# Patient Record
Sex: Female | Born: 1985 | Race: White | Hispanic: No | Marital: Married | Smoking: Former smoker
Health system: Southern US, Community
[De-identification: ages and names within clinical notes are randomized; demographics above are authoritative.]

## PROBLEM LIST (undated history)

## (undated) DIAGNOSIS — F329 Major depressive disorder, single episode, unspecified: Secondary | ICD-10-CM

## (undated) DIAGNOSIS — R87629 Unspecified abnormal cytological findings in specimens from vagina: Secondary | ICD-10-CM

## (undated) DIAGNOSIS — F32A Depression, unspecified: Secondary | ICD-10-CM

## (undated) DIAGNOSIS — R87810 Cervical high risk human papillomavirus (HPV) DNA test positive: Secondary | ICD-10-CM

## (undated) DIAGNOSIS — I10 Essential (primary) hypertension: Secondary | ICD-10-CM

## (undated) HISTORY — DX: Essential (primary) hypertension: I10

## (undated) HISTORY — DX: Major depressive disorder, single episode, unspecified: F32.9

## (undated) HISTORY — PX: OOPHORECTOMY: SHX86

## (undated) HISTORY — PX: LAPAROSCOPIC APPENDECTOMY: SUR753

## (undated) HISTORY — DX: Unspecified abnormal cytological findings in specimens from vagina: R87.629

## (undated) HISTORY — PX: OVARIAN CYST REMOVAL: SHX89

## (undated) HISTORY — DX: Cervical high risk human papillomavirus (HPV) DNA test positive: R87.810

## (undated) HISTORY — DX: Depression, unspecified: F32.A

---
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
## 2004-04-19 ENCOUNTER — Ambulatory Visit (HOSPITAL_COMMUNITY): Admission: RE | Admit: 2004-04-19 | Discharge: 2004-04-19 | Payer: Self-pay | Admitting: Family Medicine

## 2004-05-19 ENCOUNTER — Other Ambulatory Visit: Admission: RE | Admit: 2004-05-19 | Discharge: 2004-05-19 | Payer: Self-pay | Admitting: Obstetrics and Gynecology

## 2004-05-20 ENCOUNTER — Other Ambulatory Visit: Admission: RE | Admit: 2004-05-20 | Discharge: 2004-05-20 | Payer: Self-pay | Admitting: Obstetrics and Gynecology

## 2005-09-10 ENCOUNTER — Inpatient Hospital Stay (HOSPITAL_COMMUNITY): Admission: AD | Admit: 2005-09-10 | Discharge: 2005-09-13 | Payer: Self-pay | Admitting: Obstetrics and Gynecology

## 2007-06-18 ENCOUNTER — Other Ambulatory Visit: Admission: RE | Admit: 2007-06-18 | Discharge: 2007-06-18 | Payer: Self-pay | Admitting: Obstetrics and Gynecology

## 2008-06-24 ENCOUNTER — Other Ambulatory Visit: Admission: RE | Admit: 2008-06-24 | Discharge: 2008-06-24 | Payer: Self-pay | Admitting: Obstetrics and Gynecology

## 2009-07-22 ENCOUNTER — Other Ambulatory Visit: Admission: RE | Admit: 2009-07-22 | Discharge: 2009-07-22 | Payer: Self-pay | Admitting: Obstetrics & Gynecology

## 2009-10-06 ENCOUNTER — Emergency Department (HOSPITAL_COMMUNITY): Admission: EM | Admit: 2009-10-06 | Discharge: 2009-10-07 | Payer: Self-pay | Admitting: Emergency Medicine

## 2010-04-20 ENCOUNTER — Other Ambulatory Visit: Admission: RE | Admit: 2010-04-20 | Discharge: 2010-04-20 | Payer: Self-pay | Admitting: Obstetrics and Gynecology

## 2010-12-17 ENCOUNTER — Inpatient Hospital Stay (HOSPITAL_COMMUNITY)
Admission: AD | Admit: 2010-12-17 | Discharge: 2010-12-18 | Disposition: A | Discharge status: Home / Self Care | Payer: Medicaid Other | Source: Ambulatory Visit | Attending: Obstetrics & Gynecology | Admitting: Obstetrics & Gynecology

## 2010-12-17 DIAGNOSIS — W19XXXA Unspecified fall, initial encounter: Secondary | ICD-10-CM | POA: Insufficient documentation

## 2010-12-17 DIAGNOSIS — O368190 Decreased fetal movements, unspecified trimester, not applicable or unspecified: Secondary | ICD-10-CM

## 2011-01-19 ENCOUNTER — Other Ambulatory Visit: Payer: Self-pay | Admitting: Obstetrics & Gynecology

## 2011-01-19 DIAGNOSIS — O365990 Maternal care for other known or suspected poor fetal growth, unspecified trimester, not applicable or unspecified: Secondary | ICD-10-CM

## 2011-01-19 DIAGNOSIS — I10 Essential (primary) hypertension: Secondary | ICD-10-CM

## 2011-01-19 DIAGNOSIS — Z3689 Encounter for other specified antenatal screening: Secondary | ICD-10-CM

## 2011-01-26 ENCOUNTER — Other Ambulatory Visit: Payer: Self-pay | Admitting: Obstetrics & Gynecology

## 2011-01-26 ENCOUNTER — Ambulatory Visit (HOSPITAL_COMMUNITY)
Admission: RE | Admit: 2011-01-26 | Discharge: 2011-01-26 | Disposition: A | Discharge status: Home / Self Care | Payer: Medicaid Other | Source: Ambulatory Visit | Attending: Obstetrics & Gynecology | Admitting: Obstetrics & Gynecology

## 2011-01-26 DIAGNOSIS — Z3689 Encounter for other specified antenatal screening: Secondary | ICD-10-CM

## 2011-01-26 DIAGNOSIS — O365990 Maternal care for other known or suspected poor fetal growth, unspecified trimester, not applicable or unspecified: Secondary | ICD-10-CM

## 2011-01-26 DIAGNOSIS — I10 Essential (primary) hypertension: Secondary | ICD-10-CM

## 2011-01-26 DIAGNOSIS — O10019 Pre-existing essential hypertension complicating pregnancy, unspecified trimester: Secondary | ICD-10-CM | POA: Insufficient documentation

## 2011-02-17 NOTE — H&P (Signed)
NAMEAMETHYST, GAINER NO.:  [id number]   MEDICAL RECORD NO.:  [id number]          PATIENT TYPE:  INP   LOCATION:  LDR1                          FACILITY:  [facility]   PHYSICIAN:  Tilda Burrow, M.D. DATE OF BIRTH:  1986/06/30   DATE OF ADMISSION:  DATE OF DISCHARGE:  LH                                HISTORY & PHYSICAL   She is going to be induced at the birthing center on the evening of September 09, 2005.   CHIEF COMPLAINT:  Induction of labor secondary to advanced cervical dilation  and maternal discomfort.   HISTORY OF PRESENT ILLNESS:  The patient sought prenatal care in the early  second trimester, not knowing that she was pregnant and has had regular  visits since then. She was noted to have a complete previa up until about 30  weeks pregnancy where it gradually got out of the way, and she no longer has  any previa at this time per ultrasound by Dr. Emelda Fear on August 16, 2005.  Blood type is A positive. Rubella not immune. HBSAG, HIV, RPR, gonorrhea,  chlamydia are all negative. She is also negative for HSV 1 and 2. Group B  strep is also negative. Blood pressures have been anywhere from 110s to 130s  over 60s to 80s. Total weight gain has been about 40 pounds with appropriate  fundal height growth. Prenatal labs have been uneventful.   PAST MEDICAL HISTORY:  Positive for some depression. She has not experienced  depression during this pregnancy.   PAST SURGICAL HISTORY:  Positive for removal of a mature teratoma at age 83.  It actually weighed 18 pounds. She had removal of her left ovary at the time  with also an incidental appendectomy.   ALLERGIES:  No known drug allergies.   MEDICATIONS:  No medications at this time.   SOCIAL HISTORY:  Is involved with the father of the baby. Lives with  parents. Denies alcohol or drug use. Smokes an occasional cigarette.   FAMILY HISTORY:  Positive for hypertension, diabetes, and coronary artery  disease.   PHYSICAL EXAMINATION:  HEENT:  Within normal limits.  LUNGS:  Clear.  HEART:  Regular rate and rhythm.  ABDOMEN:  Soft and nontender. Fundal height is 40 cm. Estimated fetal weight  about 7-1/2 to 8 pounds. Cervix is 2 cm, 25% effaced, -2 station posterior.  EXTREMITIES:  Legs have trace edema.   IMPRESSION:  Intrauterine pregnancy at 40 weeks desiring elective induction  of labor. We will proceed with a Foley balloon ripening of the cervix on the  evening of September 09, 2005 with plans for Pitocin and AROM in the morning.      Jacklyn Shell, C.N.M.      Tilda Burrow, M.D.  Electronically Signed    FC/MEDQ  D:  09/07/2005  T:  09/07/2005  Job:  [id number]   cc:   Corrie Mckusick, M.D.  Fax: [phone]

## 2011-02-17 NOTE — Op Note (Signed)
NAMERAINEE, SWEATT            ACCOUNT NO.:  [id number]   MEDICAL RECORD NO.:  [id number]          PATIENT TYPE:  INP   LOCATION:  LDR1                          FACILITY:  [facility]   PHYSICIAN:  Tilda Burrow, M.D. DATE OF BIRTH:  05-13-1986   DATE OF PROCEDURE:  09/11/2005  DATE OF DISCHARGE:                                 OPERATIVE REPORT   EPIDURAL CATHETER NOTE:  Continuous lumbar epidural catheter placed at L2-3  interspace on first attempt with the patient sitting flexed forward with  loss-of-resistance technique identified the epidural space 7-cm beneath the  skin. The patient had a very _edematous_ back, and we had absolutely no  landmarks to go by, but we were fortunately lucky to get the epidural space  on the first attempt. A 5-cc test dose of 1.5% lidocaine with epinephrine  was instilled followed by continuous infusion of 12 cc per hour. There was  an initial 9 cc bolus of the 0.125% Marcaine solution. The patient had the  catheter taped to her back, and the patient developed symmetric analgesic  effect at T8 level.      Tilda Burrow, M.D.  Electronically Signed     JVF/MEDQ  D:  09/11/2005  T:  09/12/2005  Job:  [id number]

## 2011-02-17 NOTE — Op Note (Signed)
NAMECARIANN, KINNAMON            ACCOUNT NO.:  [id number]   MEDICAL RECORD NO.:  [id number]          PATIENT TYPE:  INP   LOCATION:  LDR1                          FACILITY:  [facility]   PHYSICIAN:  Tilda Burrow, M.D. DATE OF BIRTH:  [birth year]   DATE OF PROCEDURE:  09/11/2005  DATE OF DISCHARGE:                                 OPERATIVE REPORT   DATE OF ONSET OF LABOR:  September 11, 2005 at 6 a.m.   DATE OF DELIVERY:  September 11, 2005 at 1522.   Length of first stage labor: 9 hours 30 minutes.   Length of second stage labor: 52 minutes.   Length of third stage labor: 8 minutes.   DELIVERY NOTE:  Roslind had a Kiwi vacuum-assisted delivery from a  crowning station of a viable female infant.  Kiwi vacuum was applied come to  the green marker.  The patient was assisted x1 uterine contraction with  spontaneous delivery.  On delivery of head, nose and oropharynx was  thoroughly DeLee suctioned with approximately 12 mL of light meconium-  stained fluid obtained, and then the patient's shoulders were rotated, and  spontaneous delivery of infant was noted.   Addendum: There as a nuchal cord that was loose which infant easily slipped  through without difficulty.   Third stage of labor was actively managed with 20 units of Pitocin, D5 at a  rapid rate.  IV did infiltrate and needed to be restarted, so 10 units of  Pitocin was given IM prophylaxis.  There was a midline episiotomy cut with  lidocaine 1% infiltrate local and repaired with 2-0 Vicryl, 3 interrupted  sutures with subcuticular to close.  Good hemostasis  was obtained.  Estimated blood loss was approximately 450 mL.  There was  some uterine atony at the time of IV infiltration which was massaged firm,  IV restarted, and patient responded well to therapy.  Epidural catheter was  removed with blue tip intact.      Zerita Boers, Lanier Clam      Tilda Burrow, M.D.  Electronically Signed    DL/MEDQ  D:  16/07/9603   T:  09/11/2005  Job:  [id number]   cc:   [facility]   Corrie Mckusick, M.D.  Fax: [phone]

## 2011-02-17 NOTE — H&P (Signed)
NAMERYANNE, MORAND            ACCOUNT NO.:  [id number]   MEDICAL RECORD NO.:  [id number]          PATIENT TYPE:  INP   LOCATION:  LDR1                          FACILITY:  [facility]   PHYSICIAN:  Tilda Burrow, M.D. DATE OF BIRTH:  1985/11/14   DATE OF ADMISSION:  09/10/2005  DATE OF DISCHARGE:  LH                                HISTORY & PHYSICAL   ADMISSION DIAGNOSES:  1.  Pregnancy at 40 weeks and 1 day.  2.  Elective induction of labor, patient's request.   HISTORY OF PRESENT ILLNESS:  This 25 year old female, gravida 1, para 0, LMP  December 14, 2004, placing menstrual [facility] September 20, 2005, with ultrasound-  corrected [facility] at December 9 based on 15-week ultrasound and confirmed again  at 19 weeks.  She is admitted at 40 weeks 1 day by this consensus criteria,  for labor management and induction.  She has been seen in our office through  the pregnancy with 12 prenatal visits, most recently seen by Jacklyn Shell, C.N.M. additionally one week ago with scheduling  coordinated at that time.  The patient tired of being pregnant.  Cervix  was 2 cm, recorded at 2 cm dilated by digital exam.  Additionally, the  cervix was soft but relatively closed.  Foley bulb has been inserted without  difficulty.   PAST MEDICAL HISTORY:  Positive for depression.   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   ALLERGIES:  None.   MEDICATIONS:  Lexapro, Wellbutrin, quit in April.   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   PHYSICAL EXAMINATION:  She is a healthy-appearing female appearing younger  than her stated age.  Alert, oriented x3.  Term-size fetus, vertex  presentation.  Cervix soft, nulliparous, long, vertex confirmed on digital  exam.  Foley bulb inserted without difficulty in a posteriorly oriented  cervix.   Blood type A positive.  Urine drug  screen negative.  Hemoglobin 12,  hematocrit 39.  Hepatitis, HIV, RPR, GC, chlamydia, HSV all negative.  Group  B strep negative.  Glucose tolerance test 98 mg one hour.   PLAN:  Pitocin induction beginning at 4 a.m.  The patient will request IV  analgesics, initially may require epidural.  She attended childbirth  classes.      Tilda Burrow, M.D.  Electronically Signed     JVF/MEDQ  D:  09/10/2005  T:  09/10/2005  Job:  [id number]   cc:   Dr. Gerda Diss

## 2011-02-22 ENCOUNTER — Inpatient Hospital Stay (HOSPITAL_COMMUNITY)
Admission: AD | Admit: 2011-02-22 | Discharge: 2011-02-25 | DRG: 774 | Disposition: A | Discharge status: Home / Self Care | Payer: Medicaid Other | Source: Ambulatory Visit | Attending: Obstetrics & Gynecology | Admitting: Obstetrics & Gynecology

## 2011-02-22 ENCOUNTER — Encounter (HOSPITAL_COMMUNITY): Payer: Self-pay | Admitting: *Deleted

## 2011-02-22 DIAGNOSIS — O1002 Pre-existing essential hypertension complicating childbirth: Principal | ICD-10-CM | POA: Diagnosis present

## 2011-02-22 LAB — CBC
HCT: 32.8 % — ABNORMAL LOW (ref 36.0–46.0)
Hemoglobin: 11 g/dL — ABNORMAL LOW (ref 12.0–15.0)
MCH: 31.2 pg (ref 26.0–34.0)
MCHC: 33.5 g/dL (ref 30.0–36.0)
MCV: 92.9 fL (ref 78.0–100.0)
RDW: 14 % (ref 11.5–15.5)

## 2011-02-23 LAB — COMPREHENSIVE METABOLIC PANEL
Albumin: 2.5 g/dL — ABNORMAL LOW (ref 3.5–5.2)
Alkaline Phosphatase: 136 U/L — ABNORMAL HIGH (ref 39–117)
BUN: 7 mg/dL (ref 6–23)
Calcium: 9.5 mg/dL (ref 8.4–10.5)
Creatinine, Ser: <0.47 mg/dL (ref 0.4–1.2)
Glucose, Bld: 84 mg/dL (ref 70–99)
Potassium: 3.9 mEq/L (ref 3.5–5.1)
Total Protein: 6.4 g/dL (ref 6.0–8.3)

## 2011-02-23 LAB — PROTEIN / CREATININE RATIO, URINE
Creatinine, Urine: 54.1 mg/dL
Protein Creatinine Ratio: 0.24 — ABNORMAL HIGH (ref 0.00–0.15)

## 2011-02-23 LAB — CBC
HCT: 34.8 % — ABNORMAL LOW (ref 36.0–46.0)
MCH: 31.2 pg (ref 26.0–34.0)
MCHC: 33 g/dL (ref 30.0–36.0)
MCV: 94.3 fL (ref 78.0–100.0)
Platelets: 196 10*3/uL (ref 150–400)
RDW: 14.1 % (ref 11.5–15.5)
WBC: 8.7 10*3/uL (ref 4.0–10.5)

## 2011-02-23 LAB — URIC ACID: Uric Acid, Serum: 3.9 mg/dL (ref 2.4–7.0)

## 2011-02-23 LAB — LACTATE DEHYDROGENASE: LDH: 158 U/L (ref 94–250)

## 2011-02-24 DIAGNOSIS — O1002 Pre-existing essential hypertension complicating childbirth: Secondary | ICD-10-CM

## 2012-09-05 ENCOUNTER — Other Ambulatory Visit (HOSPITAL_COMMUNITY)
Admission: RE | Admit: 2012-09-05 | Discharge: 2012-09-05 | Disposition: A | Discharge status: Home / Self Care | Payer: Medicaid Other | Source: Ambulatory Visit | Attending: Obstetrics & Gynecology | Admitting: Obstetrics & Gynecology

## 2012-09-05 ENCOUNTER — Other Ambulatory Visit: Payer: Self-pay | Admitting: Obstetrics & Gynecology

## 2012-09-05 DIAGNOSIS — Z113 Encounter for screening for infections with a predominantly sexual mode of transmission: Secondary | ICD-10-CM | POA: Insufficient documentation

## 2012-09-05 DIAGNOSIS — Z3049 Encounter for surveillance of other contraceptives: Secondary | ICD-10-CM | POA: Insufficient documentation

## 2013-01-14 ENCOUNTER — Encounter: Payer: Self-pay | Admitting: *Deleted

## 2013-01-14 DIAGNOSIS — F32A Depression, unspecified: Secondary | ICD-10-CM | POA: Insufficient documentation

## 2013-01-14 DIAGNOSIS — F329 Major depressive disorder, single episode, unspecified: Secondary | ICD-10-CM

## 2013-01-15 ENCOUNTER — Ambulatory Visit (INDEPENDENT_AMBULATORY_CARE_PROVIDER_SITE_OTHER): Payer: Medicaid Other | Admitting: Adult Health

## 2013-01-15 ENCOUNTER — Encounter: Payer: Self-pay | Admitting: Adult Health

## 2013-01-15 VITALS — BP 120/80 | Ht 65.0 in | Wt 254.0 lb

## 2013-01-15 DIAGNOSIS — Z30431 Encounter for routine checking of intrauterine contraceptive device: Secondary | ICD-10-CM

## 2013-01-15 NOTE — Progress Notes (Signed)
Subjective:     Patient ID: Mary Burnett, female   DOB: 1986/08/12, 27 y.o.   MRN: [id number]  HPIJacquelyn is a 27 year old white female in today complaining that she has not felt her IUD string since March 1.She denies any bleeding or pain.   Review of Systems No complaints except can not feel strings of IUD. Reviewed past medical, surgical, social and family history. Reviewed medications and allergies.      Objective:   Physical Exam Blood pressure 120/80, height 5\' 5"  (1.651 m), weight 254 lb (115.214 kg), not currently breastfeeding.   Skin warm and dry. Pelvic:external genitalia normal in appearance, vagina has good color and moisture. Cervix is smooth and bulbous and no IUD strings are visible, unable to pull strings down, ultrasound performed and IUD is in place with arms extended.Uterus felt to be NSSC no adnexal masses or tenderness noted. Assessment:      IUD check    Plan:      Continue to check for strings and call with any problems

## 2013-01-15 NOTE — Patient Instructions (Addendum)
Sign up for my chart Check for IUD strings  Call prn problems

## 2014-08-03 ENCOUNTER — Encounter: Payer: Self-pay | Admitting: Adult Health

## 2015-10-09 ENCOUNTER — Emergency Department (HOSPITAL_COMMUNITY): Payer: Medicaid Other

## 2015-10-09 ENCOUNTER — Encounter (HOSPITAL_COMMUNITY): Payer: Self-pay

## 2015-10-09 ENCOUNTER — Emergency Department (HOSPITAL_COMMUNITY)
Admission: EM | Admit: 2015-10-09 | Discharge: 2015-10-09 | Disposition: A | Discharge status: Home / Self Care | Payer: Medicaid Other | Attending: Emergency Medicine | Admitting: Emergency Medicine

## 2015-10-09 DIAGNOSIS — Y998 Other external cause status: Secondary | ICD-10-CM | POA: Insufficient documentation

## 2015-10-09 DIAGNOSIS — S63501A Unspecified sprain of right wrist, initial encounter: Secondary | ICD-10-CM | POA: Insufficient documentation

## 2015-10-09 DIAGNOSIS — Y9389 Activity, other specified: Secondary | ICD-10-CM | POA: Insufficient documentation

## 2015-10-09 DIAGNOSIS — Z8659 Personal history of other mental and behavioral disorders: Secondary | ICD-10-CM | POA: Insufficient documentation

## 2015-10-09 DIAGNOSIS — I10 Essential (primary) hypertension: Secondary | ICD-10-CM | POA: Insufficient documentation

## 2015-10-09 DIAGNOSIS — W1839XA Other fall on same level, initial encounter: Secondary | ICD-10-CM | POA: Insufficient documentation

## 2015-10-09 DIAGNOSIS — Z87891 Personal history of nicotine dependence: Secondary | ICD-10-CM | POA: Insufficient documentation

## 2015-10-09 DIAGNOSIS — Z79899 Other long term (current) drug therapy: Secondary | ICD-10-CM | POA: Insufficient documentation

## 2015-10-09 DIAGNOSIS — Y9289 Other specified places as the place of occurrence of the external cause: Secondary | ICD-10-CM | POA: Insufficient documentation

## 2015-10-09 MED ORDER — ACETAMINOPHEN-CODEINE #3 300-30 MG PO TABS: ORAL_TABLET | ORAL | Status: DC

## 2015-10-09 NOTE — ED Notes (Signed)
Pt reports fell this morning and caught herself with r hand.  C/O pain and swelling to r wrist.

## 2015-10-09 NOTE — Discharge Instructions (Signed)
Your x-ray is negative for fracture or dislocation. Please use your splint over the next 10-14 days. Please see the orthopedic specialist for additional evaluation if not improving. Your blood pressure is elevated on today's visit. It is extremely important that she see her primary physician for management and monitoring of this problem. Use tylenol for mild pain. Use tylenol codeine for more severe pain. Keep your wrist elevated above your heart as much as possible. Use ice today and tomorrow. Hypertension Hypertension is another name for high blood pressure. High blood pressure forces your heart to work harder to pump blood. A blood pressure reading has two numbers, which includes a higher number over a lower number (example: 110/72). HOME CARE   Have your blood pressure rechecked by your doctor.  Only take medicine as told by your doctor. Follow the directions carefully. The medicine does not work as well if you skip doses. Skipping doses also puts you at risk for problems.  Do not smoke.  Monitor your blood pressure at home as told by your doctor. GET HELP IF:  You think you are having a reaction to the medicine you are taking.  You have repeat headaches or feel dizzy.  You have puffiness (swelling) in your ankles.  You have trouble with your vision. GET HELP RIGHT AWAY IF:   You get a very bad headache and are confused.  You feel weak, numb, or faint.  You get chest or belly (abdominal) pain.  You throw up (vomit).  You cannot breathe very well. MAKE SURE YOU:   Understand these instructions.  Will watch your condition.  Will get help right away if you are not doing well or get worse.   This information is not intended to replace advice given to you by your health care provider. Make sure you discuss any questions you have with your health care provider.   Document Released: 03/06/2008 Document Revised: 09/23/2013 Document Reviewed: 07/11/2013 Elsevier Interactive  Patient Education [employer].

## 2015-10-09 NOTE — ED Provider Notes (Signed)
CSN: [id number]     Arrival date & time 10/09/15  1102 History   First MD Initiated Contact with Patient 10/09/15 1105     Chief Complaint  Patient presents with  . Wrist Pain     (Consider location/radiation/quality/duration/timing/severity/associated sxs/prior Treatment) HPI Comments: Patient states she sustained a fall today in the bed weather, and fell on an outstretched hand. She sustained injury to the right wrist. The pain is worse when she touches the wrists or attempt to bend it. No other injury reported.  Patient is a 30 y.o. female presenting with wrist pain. The history is provided by the patient.  Wrist Pain This is a new problem. The current episode started today. The problem occurs constantly. The problem has been unchanged. Associated symptoms include joint swelling. Pertinent negatives include no arthralgias or neck pain. Exacerbated by: movement and palpation of the right wrist. She has tried nothing for the symptoms. The treatment provided no relief.    Past Medical History  Diagnosis Date  . Depression    Past Surgical History  Procedure Laterality Date  . Ovarian cyst removal     Family History  Problem Relation Age of Onset  . Diabetes Other   . Hypertension Other   . Coronary artery disease Other   . Coronary artery disease Paternal Grandfather   . Hypertension Paternal Grandfather   . Coronary artery disease Paternal Grandmother   . Hypertension Paternal Grandmother   . Coronary artery disease Maternal Grandmother   . Hypertension Maternal Grandmother   . Coronary artery disease Maternal Grandfather   . Diabetes Maternal Grandfather   . Hypertension Maternal Grandfather   . Cancer Mother 69    cervical and then lymph nodes   Social History  Substance Use Topics  . Smoking status: Former Smoker -- 6 years    Types: Cigarettes  . Smokeless tobacco: Never Used     Comment: Quit in 2009  . Alcohol Use: No   OB History    Gravida Para Term Preterm  AB TAB SAB Ectopic Multiple Living   3 2 2       2      Review of Systems  Musculoskeletal: Positive for joint swelling. Negative for arthralgias and neck pain.  All other systems reviewed and are negative.     Allergies  Review of patient's allergies indicates no known allergies.  Home Medications   Prior to Admission medications   Medication Sig Start Date End Date Taking? Authorizing Provider  Dexbrompheniramine-Pseudoeph 2-60 MG TABS Take 1 tablet by mouth daily as needed (cold).   Yes Historical Provider, MD  dextromethorphan (DELSYM) 30 MG/5ML liquid Take 30 mg by mouth as needed for cough.   Yes Historical Provider, MD  ibuprofen (ADVIL,MOTRIN) 200 MG tablet Take 800 mg by mouth every 6 (six) hours as needed.   Yes Historical Provider, MD  levonorgestrel (MIRENA) 20 MCG/24HR IUD 1 each by Intrauterine route once.   Yes Historical Provider, MD   BP 191/125 mmHg  Pulse 99  Temp(Src) 98.2 F (36.8 C) (Oral)  Resp 20  Ht 5\' 5"  (1.651 m)  Wt 115.667 kg  BMI 42.43 kg/m2  SpO2 100% Physical Exam  Constitutional: She is oriented to person, place, and time. She appears well-developed and well-nourished.  Non-toxic appearance.  HENT:  Head: Normocephalic.  Right Ear: Tympanic membrane and external ear normal.  Left Ear: Tympanic membrane and external ear normal.  Eyes: EOM and lids are normal. Pupils are equal, round, and reactive  to light.  Neck: Normal range of motion. Neck supple. Carotid bruit is not present.  Cardiovascular: Normal rate, regular rhythm, normal heart sounds, intact distal pulses and normal pulses.   Pulmonary/Chest: Breath sounds normal. No respiratory distress.  Abdominal: Soft. Bowel sounds are normal. There is no tenderness. There is no guarding.  Musculoskeletal: Normal range of motion.  There is full range of motion of the right shoulder. No pain to palpation of the right clavicle or scapula. This for range of motion of the right elbow. There is  mild-to-moderate swelling of the right wrist. There is pain to palpation of the right wrist. The radial area greater than the ulnar area. There is good range of motion of all fingers. Capillary refill is less than 2 seconds.  Lymphadenopathy:       Head (right side): No submandibular adenopathy present.       Head (left side): No submandibular adenopathy present.    She has no cervical adenopathy.  Neurological: She is alert and oriented to person, place, and time. She has normal strength. No cranial nerve deficit or sensory deficit.  Skin: Skin is warm and dry.  Psychiatric: She has a normal mood and affect. Her speech is normal.  Nursing note and vitals reviewed.   ED Course  Procedures (including critical care time) Labs Review Labs Reviewed - No data to display  Imaging Review No results found. I have personally reviewed and evaluated these images and lab results as part of my medical decision-making.   EKG Interpretation None      MDM  The xray of the right hand is negative for fracture or dislocation. Pt fitted with splint. B/p elevated. Discussed danger of untreated b/p elevations. Pt states she will see her primary MD next week for evaluation. Pt to use tylenol for mild pain. Pt to use tylenol codeine for more severe pain.   Final diagnoses:  None    *I have reviewed nursing notes, vital signs, and all appropriate lab and imaging results for this patient.[address] PA-C 10/09/15 [place], MD 10/09/15 2154

## 2015-10-09 NOTE — ED Notes (Signed)
Radial pulse present in r wrist, capillary refill wnl, pt can move fingers.

## 2015-10-09 NOTE — ED Notes (Signed)
PA at bedside.

## 2016-01-31 ENCOUNTER — Other Ambulatory Visit (HOSPITAL_COMMUNITY)
Admission: RE | Admit: 2016-01-31 | Discharge: 2016-01-31 | Disposition: A | Discharge status: Home / Self Care | Payer: MEDICAID | Source: Ambulatory Visit | Attending: Obstetrics & Gynecology | Admitting: Obstetrics & Gynecology

## 2016-01-31 ENCOUNTER — Ambulatory Visit (INDEPENDENT_AMBULATORY_CARE_PROVIDER_SITE_OTHER): Payer: Medicaid Other | Admitting: Obstetrics & Gynecology

## 2016-01-31 ENCOUNTER — Encounter: Payer: Self-pay | Admitting: Obstetrics & Gynecology

## 2016-01-31 VITALS — HR 60 | Ht 63.5 in | Wt 271.2 lb

## 2016-01-31 DIAGNOSIS — Z01419 Encounter for gynecological examination (general) (routine) without abnormal findings: Secondary | ICD-10-CM | POA: Insufficient documentation

## 2016-01-31 DIAGNOSIS — Z309 Encounter for contraceptive management, unspecified: Secondary | ICD-10-CM

## 2016-01-31 DIAGNOSIS — Z113 Encounter for screening for infections with a predominantly sexual mode of transmission: Secondary | ICD-10-CM | POA: Insufficient documentation

## 2016-01-31 NOTE — Progress Notes (Signed)
Patient ID: Mary Burnett, female   DOB: 1986/01/29, 30 y.o.   MRN: [id number] Subjective:     Mary Burnett is a 30 y.o. female here for a routine exam.  No LMP recorded. Patient is not currently having periods (Reason: IUD). [id number] Birth Control Method:  IUD Menstrual Calendar(currently): amenorrheic  Current complaints: none.   Current acute medical issues:     Recent Gynecologic History No LMP recorded. Patient is not currently having periods (Reason: IUD). Last Pap: 2013,  normal Last mammogram: ,    Past Medical History  Diagnosis Date  . Depression   . Hypertension     Past Surgical History  Procedure Laterality Date  . Ovarian cyst removal      OB History    Gravida Para Term Preterm AB TAB SAB Ectopic Multiple Living   3 2 2       2       Social History   Social History  . Marital Status: Married    Spouse Name: N/A  . Number of Children: N/A  . Years of Education: N/A   Social History Main Topics  . Smoking status: Former Smoker -- 6 years    Types: Cigarettes  . Smokeless tobacco: Never Used     Comment: Quit in 2009  . Alcohol Use: 0.0 oz/week    0 Standard drinks or equivalent per week     Comment: occassional  . Drug Use: No  . Sexual Activity: Yes    Birth Control/ Protection: IUD   Other Topics Concern  . None   Social History Narrative    Family History  Problem Relation Age of Onset  . Coronary artery disease Paternal Grandfather   . Hypertension Paternal Grandfather   . Coronary artery disease Paternal Grandmother   . Hypertension Paternal Grandmother   . Coronary artery disease Maternal Grandmother   . Hypertension Maternal Grandmother   . Coronary artery disease Maternal Grandfather   . Diabetes Maternal Grandfather   . Hypertension Maternal Grandfather   . Cancer Mother 37    cervical and then lymph nodes     Current outpatient prescriptions:  .  ibuprofen (ADVIL,MOTRIN) 200 MG tablet, Take 800 mg by mouth  every 6 (six) hours as needed., Disp: , Rfl:  .  levonorgestrel (MIRENA) 20 MCG/24HR IUD, 1 each by Intrauterine route once., Disp: , Rfl:  .  lisinopril (PRINIVIL,ZESTRIL) 20 MG tablet, Take 20 mg by mouth daily., Disp: , Rfl:   Review of Systems  Review of Systems  Constitutional: Negative for fever, chills, weight loss, malaise/fatigue and diaphoresis.  HENT: Negative for hearing loss, ear pain, nosebleeds, congestion, sore throat, neck pain, tinnitus and ear discharge.   Eyes: Negative for blurred vision, double vision, photophobia, pain, discharge and redness.  Respiratory: Negative for cough, hemoptysis, sputum production, shortness of breath, wheezing and stridor.   Cardiovascular: Negative for chest pain, palpitations, orthopnea, claudication, leg swelling and PND.  Gastrointestinal: negative for abdominal pain. Negative for heartburn, nausea, vomiting, diarrhea, constipation, blood in stool and melena.  Genitourinary: Negative for dysuria, urgency, frequency, hematuria and flank pain.  Musculoskeletal: Negative for myalgias, back pain, joint pain and falls.  Skin: Negative for itching and rash.  Neurological: Negative for dizziness, tingling, tremors, sensory change, speech change, focal weakness, seizures, loss of consciousness, weakness and headaches.  Endo/Heme/Allergies: Negative for environmental allergies and polydipsia. Does not bruise/bleed easily.  Psychiatric/Behavioral: Negative for depression, suicidal ideas, hallucinations, memory loss and substance abuse. The patient is  not nervous/anxious and does not have insomnia.        Objective:  Pulse 60, height 5' 3.5" (1.613 m), weight 271 lb 3.2 oz (123.016 kg).   Physical Exam  Vitals reviewed. Constitutional: She is oriented to person, place, and time. She appears well-developed and well-nourished.  HENT:  Head: Normocephalic and atraumatic.        Right Ear: External ear normal.  Left Ear: External ear normal.  Nose:  Nose normal.  Mouth/Throat: Oropharynx is clear and moist.  Eyes: Conjunctivae and EOM are normal. Pupils are equal, round, and reactive to light. Right eye exhibits no discharge. Left eye exhibits no discharge. No scleral icterus.  Neck: Normal range of motion. Neck supple. No tracheal deviation present. No thyromegaly present.  Cardiovascular: Normal rate, regular rhythm, normal heart sounds and intact distal pulses.  Exam reveals no gallop and no friction rub.   No murmur heard. Respiratory: Effort normal and breath sounds normal. No respiratory distress. She has no wheezes. She has no rales. She exhibits no tenderness.  GI: Soft. Bowel sounds are normal. She exhibits no distension and no mass. There is no tenderness. There is no rebound and no guarding.  Genitourinary:  Breasts no masses skin changes or nipple changes bilaterally      Vulva is normal without lesions Vagina is pink moist without discharge Cervix normal in appearance and pap is done, strings visible Uterus is normal size shape and contour Adnexa is negative with normal sized ovaries   Musculoskeletal: Normal range of motion. She exhibits no edema and no tenderness.  Neurological: She is alert and oriented to person, place, and time. She has normal reflexes. She displays normal reflexes. No cranial nerve deficit. She exhibits normal muscle tone. Coordination normal.  Skin: Skin is warm and dry. No rash noted. No erythema. No pallor.  Psychiatric: She has a normal mood and affect. Her behavior is normal. Judgment and thought content normal.       Medications Ordered at today's visit: Meds ordered this encounter  Medications  . lisinopril (PRINIVIL,ZESTRIL) 20 MG tablet    Sig: Take 20 mg by mouth daily.    Other orders placed at today's visit: No orders of the defined types were placed in this encounter.      Assessment:    Healthy female exam.    Plan:    Contraception: IUD. Follow up in: 3 years.

## 2016-02-01 LAB — CYTOLOGY - PAP

## 2016-07-02 IMAGING — DX DG WRIST COMPLETE 3+V*R*
4 series · 4 of 4 positions shown · non-contrast
Comparison: None.

CLINICAL DATA: Right wrist pain after fall.

EXAM:
RIGHT WRIST - COMPLETE 3+ VIEW

[wrist pa (1 of 2)]
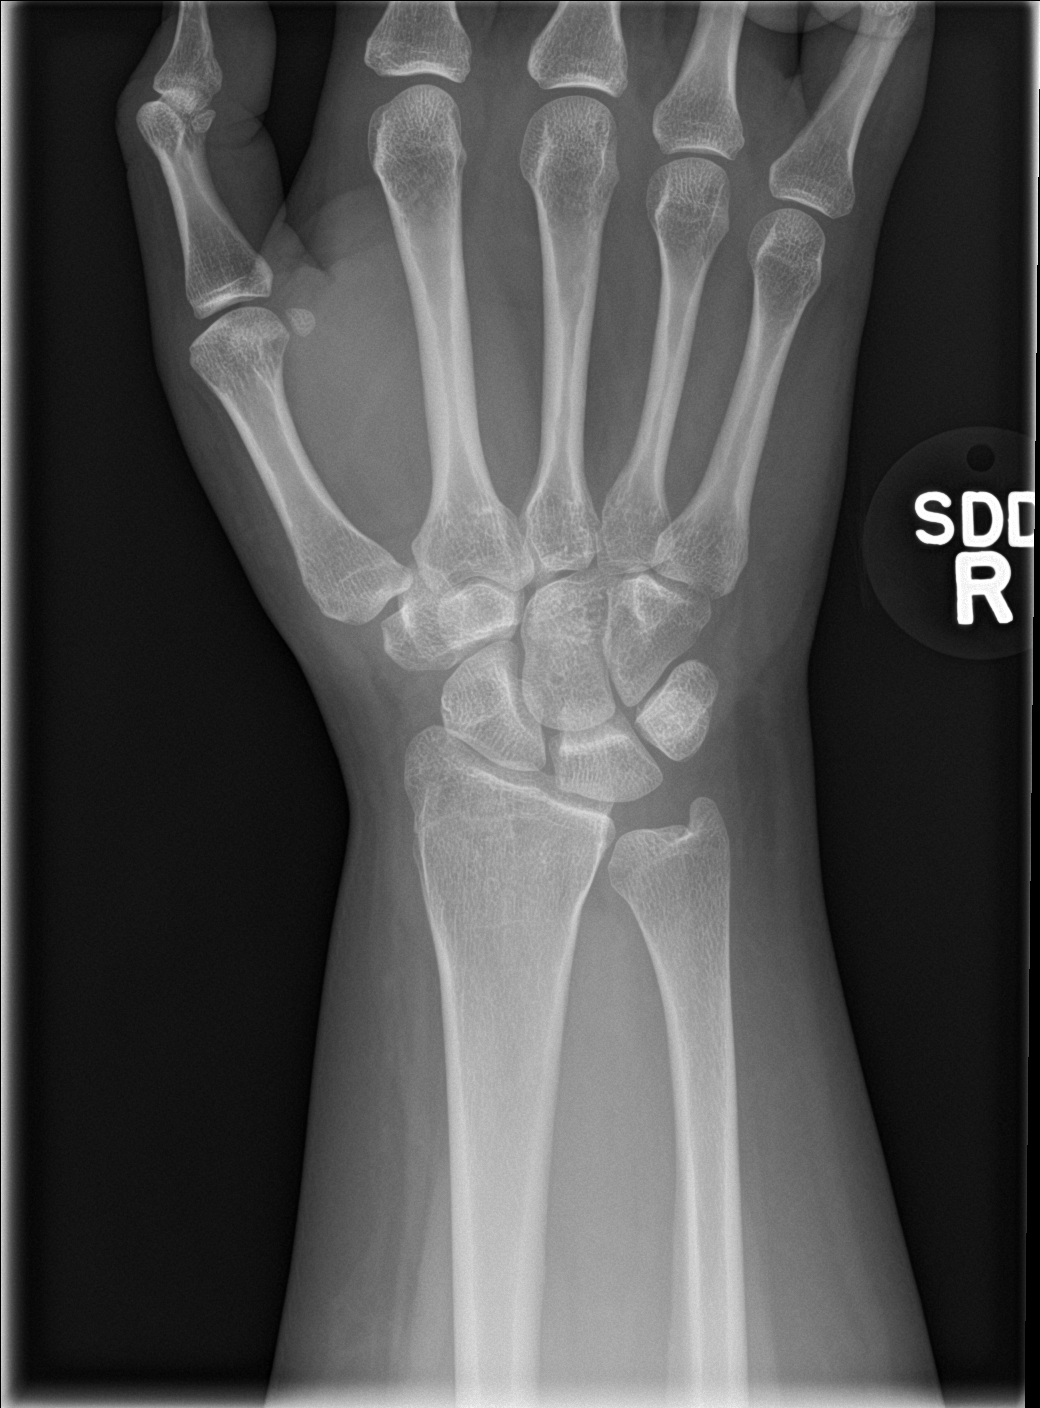

[wrist obl]
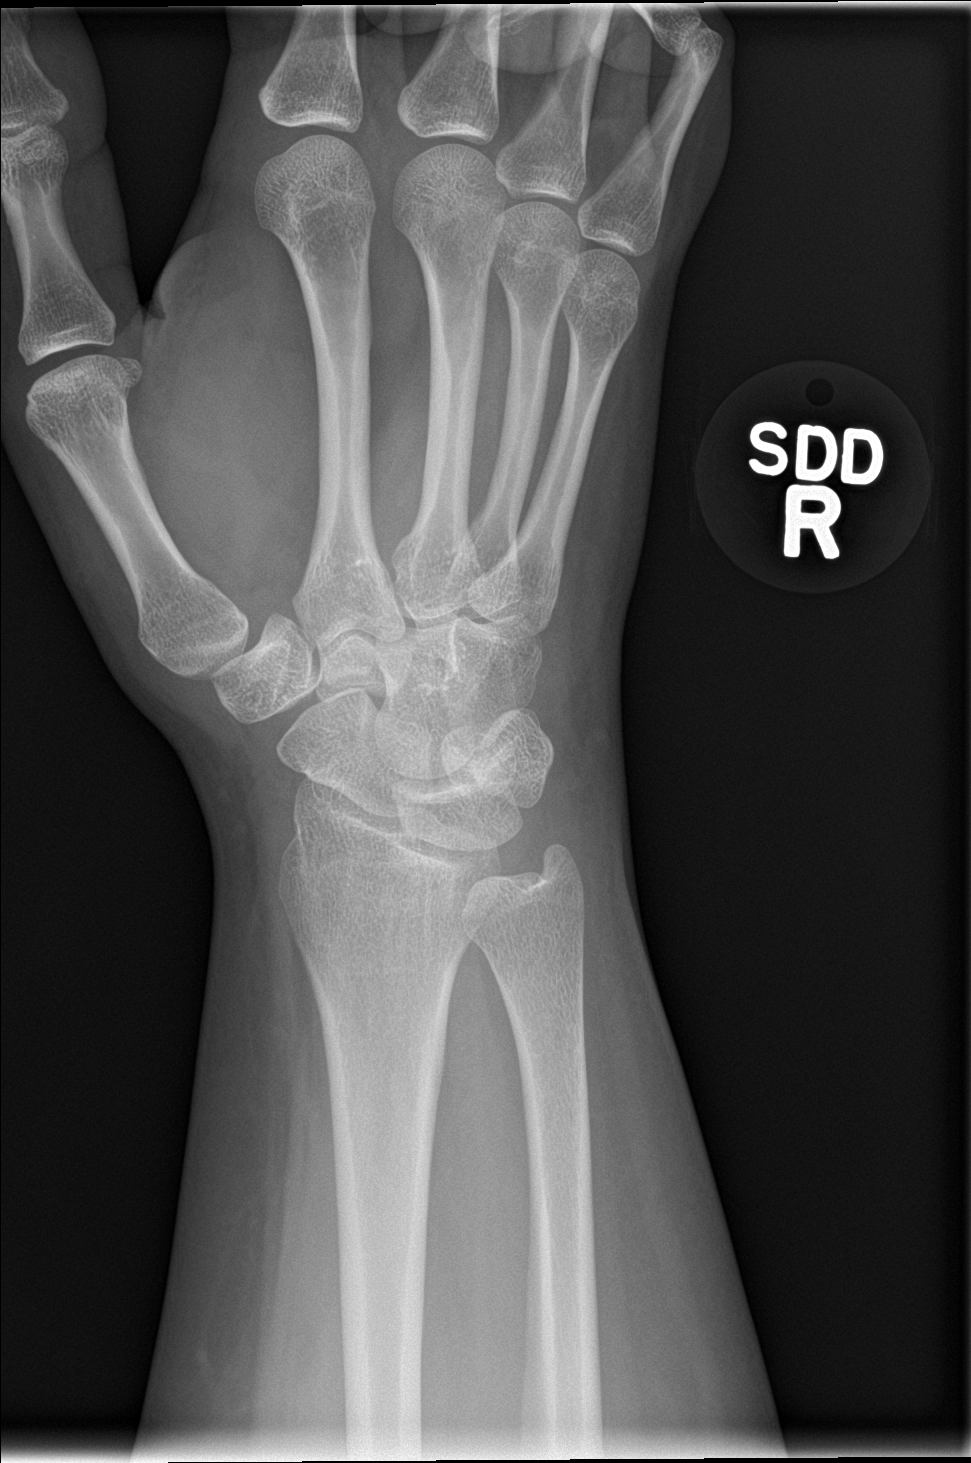

[wrist lat]
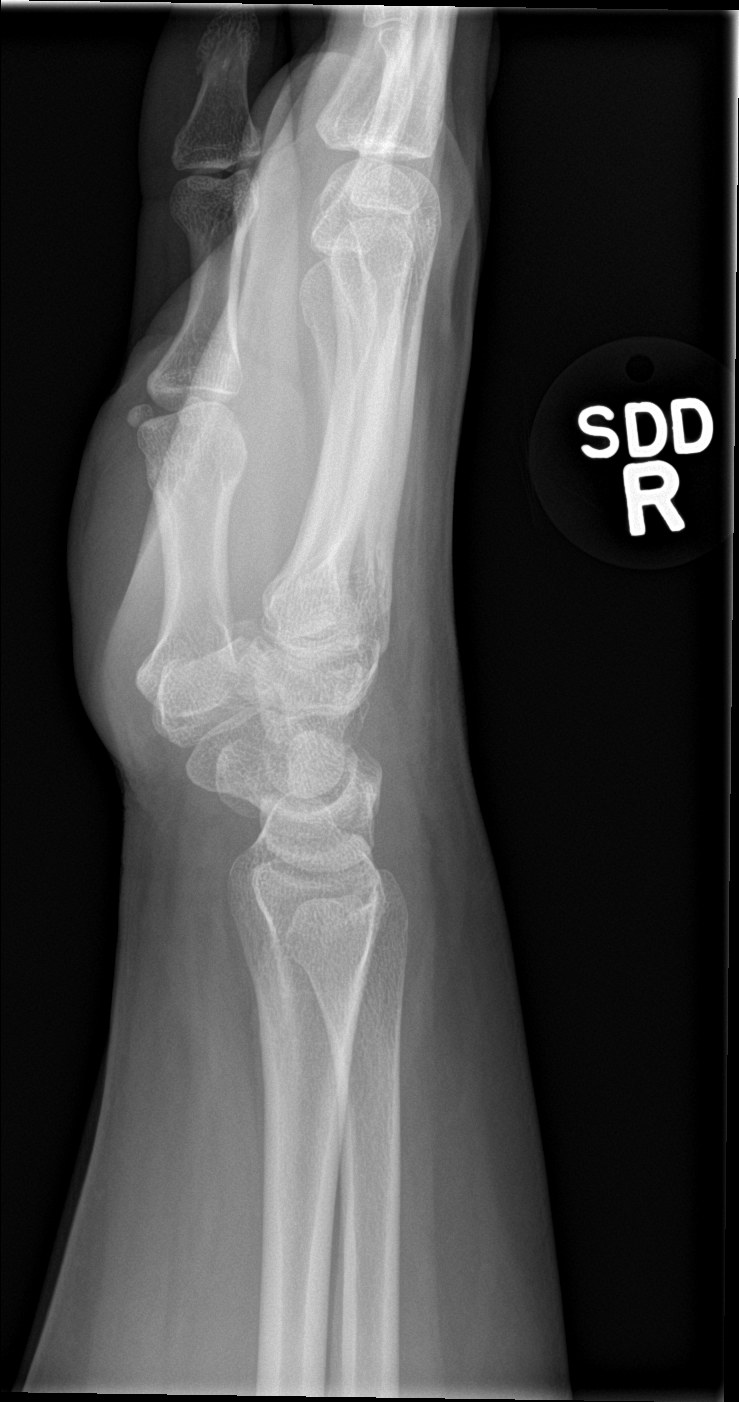

[wrist pa (2 of 2)]
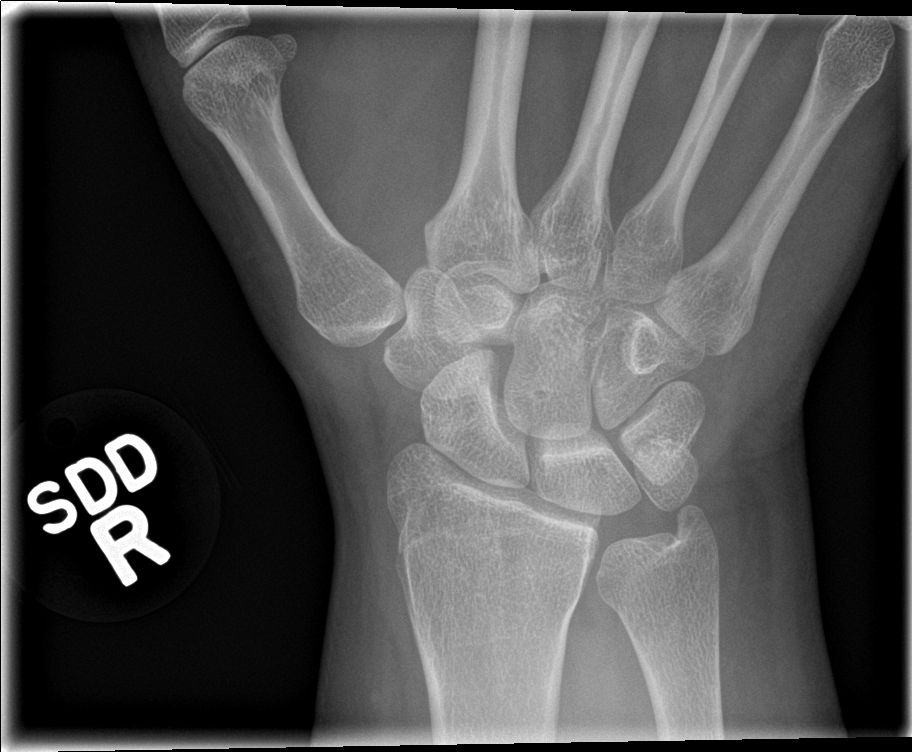

[4 of 4 positions shown; findings below may reference images not displayed]

FINDINGS: Osseous alignment is normal. Bone mineralization is normal. No
fracture line or displaced fracture fragment identified. Adjacent
soft tissues are unremarkable.
IMPRESSION: Negative.

## 2017-04-03 ENCOUNTER — Ambulatory Visit (INDEPENDENT_AMBULATORY_CARE_PROVIDER_SITE_OTHER): Payer: Medicaid Other | Admitting: Adult Health

## 2017-04-03 ENCOUNTER — Other Ambulatory Visit (HOSPITAL_COMMUNITY)
Admission: RE | Admit: 2017-04-03 | Discharge: 2017-04-03 | Disposition: A | Discharge status: Home / Self Care | Payer: Medicaid Other | Source: Ambulatory Visit | Attending: Adult Health | Admitting: Adult Health

## 2017-04-03 ENCOUNTER — Encounter: Payer: Self-pay | Admitting: Adult Health

## 2017-04-03 ENCOUNTER — Encounter (INDEPENDENT_AMBULATORY_CARE_PROVIDER_SITE_OTHER): Payer: Self-pay

## 2017-04-03 VITALS — BP 142/100 | HR 78 | Ht 64.0 in | Wt 264.5 lb

## 2017-04-03 DIAGNOSIS — Z975 Presence of (intrauterine) contraceptive device: Secondary | ICD-10-CM

## 2017-04-03 DIAGNOSIS — Z3009 Encounter for other general counseling and advice on contraception: Secondary | ICD-10-CM | POA: Diagnosis not present

## 2017-04-03 DIAGNOSIS — Z308 Encounter for other contraceptive management: Secondary | ICD-10-CM | POA: Diagnosis not present

## 2017-04-03 DIAGNOSIS — R87810 Cervical high risk human papillomavirus (HPV) DNA test positive: Secondary | ICD-10-CM

## 2017-04-03 DIAGNOSIS — Z01419 Encounter for gynecological examination (general) (routine) without abnormal findings: Secondary | ICD-10-CM

## 2017-04-03 DIAGNOSIS — I10 Essential (primary) hypertension: Secondary | ICD-10-CM | POA: Insufficient documentation

## 2017-04-03 HISTORY — DX: Cervical high risk human papillomavirus (HPV) DNA test positive: R87.810

## 2017-04-03 NOTE — Progress Notes (Signed)
Patient ID: Mary Burnett, female   DOB: 1985/10/11, 31 y.o.   MRN: [id number] History of Present Illness:  Mary Burnett is a 31 year old white female, married, G2P2 in for well woman gyn exam and pap.Pap last negative was +HPV.She has had BP meds changed recently. PCP is [facility].  Current Medications, Allergies, Past Medical History, Past Surgical History, Family History and Social History were reviewed in [employer] record.     Review of Systems: Patient denies any headaches, hearing loss, fatigue, blurred vision, shortness of breath, chest pain(has occasionally, but thinks it is indigestion) abdominal pain, problems with bowel movements, urination, or intercourse. No joint pain or mood swings. BP meds changed recently, no periods with IUD, which is past due to be removed,no periods with IUD.   Physical Exam:BP (!) 142/100 (BP Location: Left Arm, Patient Position: Sitting, Cuff Size: Large)   Pulse 78   Ht 5\' 4"  (1.626 m)   Wt 264 lb 8 oz (120 kg)   BMI 45.40 kg/m  General:  Well developed, well nourished, no acute distress Skin:  Warm and dry Neck:  Midline trachea, normal thyroid, good ROM, no lymphadenopathy Lungs; Clear to auscultation bilaterally Breast:  No dominant palpable mass, retraction, or nipple discharge Cardiovascular: Regular rate and rhythm Abdomen:  Soft, non tender, no hepatosplenomegaly Pelvic:  External genitalia is normal in appearance, no lesions.  The vagina is normal in appearance. Urethra has no lesions or masses. The cervix is bulbous, pap with HPV and GC/chlamydia performed. IUD strings short, just saw 1. Uterus is felt to be normal size, shape, and contour.  No adnexal masses or tenderness noted.Bladder is non tender, no masses felt. Extremities/musculoskeletal:  No swelling or varicosities noted, no clubbing or cyanosis Psych:  No mood changes, alert and cooperative,seems happy PHQ 2 score 0.She signed tubal papers,  given handout on tubal by [facility], can have IUD removed at tubal ligation, also discussed ablation, as option too.  Impression: 1. Encounter for routine gynecological examination with Papanicolaou smear of cervix   2. Family planning   3. Cervical high risk human papillomavirus (HPV) DNA test positive   4. IUD (intrauterine device) in place   5. Essential hypertension       Plan: Check HIV and RPR Return in 4 weeks to talk tubal with Dr Elonda Husky Physical in 1 year, pap in 3 if normal but if HPV + again , will get colpo F/U with PCP about BP And if has chest pain, go to ER

## 2017-04-04 LAB — RPR: RPR Ser Ql: NONREACTIVE

## 2017-04-04 LAB — HIV ANTIBODY (ROUTINE TESTING W REFLEX): HIV Screen 4th Generation wRfx: NONREACTIVE

## 2017-04-06 LAB — CYTOLOGY - PAP
ADEQUACY: ABSENT
Chlamydia: NEGATIVE
Diagnosis: NEGATIVE
HPV: NOT DETECTED
NEISSERIA GONORRHEA: NEGATIVE

## 2017-05-03 ENCOUNTER — Ambulatory Visit (INDEPENDENT_AMBULATORY_CARE_PROVIDER_SITE_OTHER): Payer: Medicaid Other | Admitting: Obstetrics & Gynecology

## 2017-05-03 ENCOUNTER — Encounter: Payer: Self-pay | Admitting: Obstetrics & Gynecology

## 2017-05-03 VITALS — BP 130/88 | HR 64 | Wt 274.0 lb

## 2017-05-03 DIAGNOSIS — Z01818 Encounter for other preprocedural examination: Secondary | ICD-10-CM

## 2017-05-03 DIAGNOSIS — Z3009 Encounter for other general counseling and advice on contraception: Secondary | ICD-10-CM

## 2017-05-03 DIAGNOSIS — Z302 Encounter for sterilization: Secondary | ICD-10-CM

## 2017-05-08 NOTE — Patient Instructions (Signed)
Mary Burnett  05/08/2017     @PREFPERIOPPHARMACY @   Your procedure is scheduled on  05/16/2017   Report to [facility] at  740  A.M.  Call this number if you have problems the morning of surgery:  [phone]   Remember:  Do not eat food or drink liquids after midnight.  Take these medicines the morning of surgery with A SIP OF WATER  Wellbutrin, cozaar.   Do not wear jewelry, make-up or nail polish.  Do not wear lotions, powders, or perfumes, or deoderant.  Do not shave 48 hours prior to surgery.  Men may shave face and neck.  Do not bring valuables to the hospital.  [facility] is not responsible for any belongings or valuables.  Contacts, dentures or bridgework may not be worn into surgery.  Leave your suitcase in the car.  After surgery it may be brought to your room.  For patients admitted to the hospital, discharge time will be determined by your treatment team.  Patients discharged the day of surgery will not be allowed to drive home.   Name and phone number of your driver:   family Special instructions:  None  Please read over the following fact sheets that you were given. Anesthesia Post-op Instructions and Care and Recovery After Surgery       Laparoscopic Tubal Ligation Laparoscopic tubal ligation is a procedure to close the fallopian tubes. This is done so that you cannot get pregnant. When the fallopian tubes are closed, the eggs that your ovaries release cannot enter the uterus, and sperm cannot reach the released eggs. A laparoscopic tubal ligation is sometimes called "getting your tubes tied." You should not have this procedure if you want to get pregnant someday or if you are unsure about having more children. Tell a health care provider about:  Any allergies you have.  All medicines you are taking, including vitamins, herbs, eye drops, creams, and over-the-counter medicines.  Any problems you or family members have had with  anesthetic medicines.  Any blood disorders you have.  Any surgeries you have had.  Any medical conditions you have.  Whether you are pregnant or may be pregnant.  Any past pregnancies. What are the risks? Generally, this is a safe procedure. However, problems may occur, including:  Infection.  Bleeding.  Injury to surrounding organs.  Side effects from anesthetics.  Failure of the procedure.  This procedure can increase your risk of a kind of pregnancy in which a fertilized egg attaches to the outside of the uterus (ectopic pregnancy). What happens before the procedure?  Ask your health care provider about: ? Changing or stopping your regular medicines. This is especially important if you are taking diabetes medicines or blood thinners. ? Taking medicines such as aspirin and ibuprofen. These medicines can thin your blood. Do not take these medicines before your procedure if your health care provider instructs you not to.  Follow instructions from your health care provider about eating and drinking restrictions.  Plan to have someone take you home after the procedure.  If you go home right after the procedure, plan to have someone with you for 24 hours. What happens during the procedure?  You will be given one or more of the following: ? A medicine to help you relax (sedative). ? A medicine to numb the area (local anesthetic). ? A medicine to make you fall asleep (general anesthetic). ? A medicine that  is injected into an area of your body to numb everything below the injection site (regional anesthetic).  An IV tube will be inserted into one of your veins. It will be used to give you medicines and fluids during the procedure.  Your bladder may be emptied with a small tube (catheter).  If you have been given a general anesthetic, a tube will be put down your throat to help you breathe.  Two small cuts (incisions) will be made in your lower abdomen and near your belly  button.  Your abdomen will be inflated with a gas. This will let the surgeon see better and will give the surgeon room to work.  A thin, lighted tube (laparoscope) with a camera attached will be inserted into your abdomen through one of the incisions. Small instruments will be inserted through the other incision.  The fallopian tubes will be tied off, burned (cauterized), or blocked with a clip, ring, or clamp. A small portion in the center of each fallopian tube may be removed.  The gas will be released from the abdomen.  The incisions will be closed with stitches (sutures).  A bandage (dressing) will be placed over the incisions. The procedure may vary among health care providers and hospitals. What happens after the procedure?  Your blood pressure, heart rate, breathing rate, and blood oxygen level will be monitored often until the medicines you were given have worn off.  You will be given medicine to help with pain, nausea, and vomiting as needed. This information is not intended to replace advice given to you by your health care provider. Make sure you discuss any questions you have with your health care provider. Document Released: 12/25/2000 Document Revised: 02/24/2016 Document Reviewed: 08/29/2015 Elsevier Interactive Patient Education  2018 [employer].  Laparoscopic Tubal Ligation, Care After Refer to this sheet in the next few weeks. These instructions provide you with information about caring for yourself after your procedure. Your health care provider may also give you more specific instructions. Your treatment has been planned according to current medical practices, but problems sometimes occur. Call your health care provider if you have any problems or questions after your procedure. What can I expect after the procedure? After the procedure, it is common to have:  A sore throat.  Discomfort in your shoulder.  Mild discomfort or cramping in your abdomen.  Gas  pains.  Pain or soreness in the area where the surgical cut (incision) was made.  A bloated feeling.  Tiredness.  Nausea.  Vomiting.  Follow these instructions at home: Medicines  Take over-the-counter and prescription medicines only as told by your health care provider.  Do not take aspirin because it can cause bleeding.  Do not drive or operate heavy machinery while taking prescription pain medicine. Activity  Rest for the rest of the day.  Return to your normal activities as told by your health care provider. Ask your health care provider what activities are safe for you. Incision care   Follow instructions from your health care provider about how to take care of your incision. Make sure you: ? Wash your hands with soap and water before you change your bandage (dressing). If soap and water are not available, use hand sanitizer. ? Change your dressing as told by your health care provider. ? Leave stitches (sutures) in place. They may need to stay in place for 2 weeks or longer.  Check your incision area every day for signs of infection. Check for: ?  More redness, swelling, or pain. ? More fluid or blood. ? Warmth. ? Pus or a bad smell. Other Instructions  Do not take baths, swim, or use a hot tub until your health care provider approves. You may take showers.  Keep all follow-up visits as told by your health care provider. This is important.  Have someone help you with your daily household tasks for the first few days. Contact a health care provider if:  You have more redness, swelling, or pain around your incision.  Your incision feels warm to the touch.  You have pus or a bad smell coming from your incision.  The edges of your incision break open after the sutures have been removed.  Your pain does not improve after 2-3 days.  You have a rash.  You repeatedly become dizzy or light-headed.  Your pain medicine is not helping.  You are constipated. Get  help right away if:  You have a fever.  You faint.  You have increasing pain in your abdomen.  You have severe pain in one or both of your shoulders.  You have fluid or blood coming from your sutures or from your vagina.  You have shortness of breath or difficulty breathing.  You have chest pain or leg pain.  You have ongoing nausea, vomiting, or diarrhea. This information is not intended to replace advice given to you by your health care provider. Make sure you discuss any questions you have with your health care provider. Document Released: 04/07/2005 Document Revised: 02/21/2016 Document Reviewed: 08/29/2015 Elsevier Interactive Patient Education  2018 [place] Anesthesia, Adult General anesthesia is the use of medicines to make a person "go to sleep" (be unconscious) for a medical procedure. General anesthesia is often recommended when a procedure:  Is long.  Requires you to be still or in an unusual position.  Is major and can cause you to lose blood.  Is impossible to do without general anesthesia.  The medicines used for general anesthesia are called general anesthetics. In addition to making you sleep, the medicines:  Prevent pain.  Control your blood pressure.  Relax your muscles.  Tell a health care provider about:  Any allergies you have.  All medicines you are taking, including vitamins, herbs, eye drops, creams, and over-the-counter medicines.  Any problems you or family members have had with anesthetic medicines.  Types of anesthetics you have had in the past.  Any bleeding disorders you have.  Any surgeries you have had.  Any medical conditions you have.  Any history of heart or lung conditions, such as heart failure, sleep apnea, or chronic obstructive pulmonary disease (COPD).  Whether you are pregnant or may be pregnant.  Whether you use tobacco, alcohol, marijuana, or street drugs.  Any history of Armed forces logistics/support/administrative officer.  Any  history of depression or anxiety. What are the risks? Generally, this is a safe procedure. However, problems may occur, including:  Allergic reaction to anesthetics.  Lung and heart problems.  Inhaling food or liquids from your stomach into your lungs (aspiration).  Injury to nerves.  Waking up during your procedure and being unable to move (rare).  Extreme agitation or a state of mental confusion (delirium) when you wake up from the anesthetic.  Air in the bloodstream, which can lead to stroke.  These problems are more likely to develop if you are having a major surgery or if you have an advanced medical condition. You can prevent some of these complications by answering  all of your health care provider's questions thoroughly and by following all pre-procedure instructions. General anesthesia can cause side effects, including:  Nausea or vomiting  A sore throat from the breathing tube.  Feeling cold or shivery.  Feeling tired, washed out, or achy.  Sleepiness or drowsiness.  Confusion or agitation.  What happens before the procedure? Staying hydrated Follow instructions from your health care provider about hydration, which may include:  Up to 2 hours before the procedure - you may continue to drink clear liquids, such as water, clear fruit juice, black coffee, and plain tea.  Eating and drinking restrictions Follow instructions from your health care provider about eating and drinking, which may include:  8 hours before the procedure - stop eating heavy meals or foods such as meat, fried foods, or fatty foods.  6 hours before the procedure - stop eating light meals or foods, such as toast or cereal.  6 hours before the procedure - stop drinking milk or drinks that contain milk.  2 hours before the procedure - stop drinking clear liquids.  Medicines  Ask your health care provider about: ? Changing or stopping your regular medicines. This is especially important if  you are taking diabetes medicines or blood thinners. ? Taking medicines such as aspirin and ibuprofen. These medicines can thin your blood. Do not take these medicines before your procedure if your health care provider instructs you not to. ? Taking new dietary supplements or medicines. Do not take these during the week before your procedure unless your health care provider approves them.  If you are told to take a medicine or to continue taking a medicine on the day of the procedure, take the medicine with sips of water. General instructions   Ask if you will be going home the same day, the following day, or after a longer hospital stay. ? Plan to have someone take you home. ? Plan to have someone stay with you for the first 24 hours after you leave the hospital or clinic.  For 3-6 weeks before the procedure, try not to use any tobacco products, such as cigarettes, chewing tobacco, and e-cigarettes.  You may brush your teeth on the morning of the procedure, but make sure to spit out the toothpaste. What happens during the procedure?  You will be given anesthetics through a mask and through an IV tube in one of your veins.  You may receive medicine to help you relax (sedative).  As soon as you are asleep, a breathing tube may be used to help you breathe.  An anesthesia specialist will stay with you throughout the procedure. He or she will help keep you comfortable and safe by continuing to give you medicines and adjusting the amount of medicine that you get. He or she will also watch your blood pressure, pulse, and oxygen levels to make sure that the anesthetics do not cause any problems.  If a breathing tube was used to help you breathe, it will be removed before you wake up. The procedure may vary among health care providers and hospitals. What happens after the procedure?  You will wake up, often slowly, after the procedure is complete, usually in a recovery area.  Your blood  pressure, heart rate, breathing rate, and blood oxygen level will be monitored until the medicines you were given have worn off.  You may be given medicine to help you calm down if you feel anxious or agitated.  If you will be going home  the same day, your health care provider may check to make sure you can stand, drink, and urinate.  Your health care providers will treat your pain and side effects before you go home.  Do not drive for 24 hours if you received a sedative.  You may: ? Feel nauseous and vomit. ? Have a sore throat. ? Have mental slowness. ? Feel cold or shivery. ? Feel sleepy. ? Feel tired. ? Feel sore or achy, even in parts of your body where you did not have surgery. This information is not intended to replace advice given to you by your health care provider. Make sure you discuss any questions you have with your health care provider. Document Released: 12/26/2007 Document Revised: 02/29/2016 Document Reviewed: 09/02/2015 Elsevier Interactive Patient Education  2018 [place] Anesthesia, Adult, Care After These instructions provide you with information about caring for yourself after your procedure. Your health care provider may also give you more specific instructions. Your treatment has been planned according to current medical practices, but problems sometimes occur. Call your health care provider if you have any problems or questions after your procedure. What can I expect after the procedure? After the procedure, it is common to have:  Vomiting.  A sore throat.  Mental slowness.  It is common to feel:  Nauseous.  Cold or shivery.  Sleepy.  Tired.  Sore or achy, even in parts of your body where you did not have surgery.  Follow these instructions at home: For at least 24 hours after the procedure:  Do not: ? Participate in activities where you could fall or become injured. ? Drive. ? Use heavy machinery. ? Drink alcohol. ? Take  sleeping pills or medicines that cause drowsiness. ? Make important decisions or sign legal documents. ? Take care of children on your own.  Rest. Eating and drinking  If you vomit, drink water, juice, or soup when you can drink without vomiting.  Drink enough fluid to keep your urine clear or pale yellow.  Make sure you have little or no nausea before eating solid foods.  Follow the diet recommended by your health care provider. General instructions  Have a responsible adult stay with you until you are awake and alert.  Return to your normal activities as told by your health care provider. Ask your health care provider what activities are safe for you.  Take over-the-counter and prescription medicines only as told by your health care provider.  If you smoke, do not smoke without supervision.  Keep all follow-up visits as told by your health care provider. This is important. Contact a health care provider if:  You continue to have nausea or vomiting at home, and medicines are not helpful.  You cannot drink fluids or start eating again.  You cannot urinate after 8-12 hours.  You develop a skin rash.  You have fever.  You have increasing redness at the site of your procedure. Get help right away if:  You have difficulty breathing.  You have chest pain.  You have unexpected bleeding.  You feel that you are having a life-threatening or urgent problem. This information is not intended to replace advice given to you by your health care provider. Make sure you discuss any questions you have with your health care provider. Document Released: 12/25/2000 Document Revised: 02/21/2016 Document Reviewed: 09/02/2015 Elsevier Interactive Patient Education  [employer].

## 2017-05-11 ENCOUNTER — Encounter (HOSPITAL_COMMUNITY): Payer: Self-pay

## 2017-05-11 ENCOUNTER — Other Ambulatory Visit: Payer: Self-pay | Admitting: Obstetrics & Gynecology

## 2017-05-11 ENCOUNTER — Encounter (HOSPITAL_COMMUNITY)
Admission: RE | Admit: 2017-05-11 | Discharge: 2017-05-11 | Disposition: A | Discharge status: Home / Self Care | Payer: Medicaid Other | Source: Ambulatory Visit | Attending: Obstetrics & Gynecology | Admitting: Obstetrics & Gynecology

## 2017-05-11 ENCOUNTER — Other Ambulatory Visit: Payer: Self-pay

## 2017-05-11 DIAGNOSIS — Z01818 Encounter for other preprocedural examination: Secondary | ICD-10-CM | POA: Insufficient documentation

## 2017-05-11 LAB — COMPREHENSIVE METABOLIC PANEL
ALT: 21 U/L (ref 14–54)
AST: 21 U/L (ref 15–41)
Albumin: 3.7 g/dL (ref 3.5–5.0)
Alkaline Phosphatase: 59 U/L (ref 38–126)
Anion gap: 9 (ref 5–15)
BUN: 11 mg/dL (ref 6–20)
CO2: 26 mmol/L (ref 22–32)
Calcium: 8.7 mg/dL — ABNORMAL LOW (ref 8.9–10.3)
Chloride: 105 mmol/L (ref 101–111)
Creatinine, Ser: 0.51 mg/dL (ref 0.44–1.00)
GFR calc Af Amer: >60 mL/min (ref >60)
GFR calc non Af Amer: >60 mL/min (ref >60)
Glucose, Bld: 85 mg/dL (ref 65–99)
Potassium: 3.7 mmol/L (ref 3.5–5.1)
Sodium: 140 mmol/L (ref 135–145)
Total Bilirubin: 0.8 mg/dL (ref 0.3–1.2)
Total Protein: 6.9 g/dL (ref 6.5–8.1)

## 2017-05-11 LAB — RAPID HIV SCREEN (HIV 1/2 AB+AG)
HIV 1/2 ANTIBODIES: NONREACTIVE
HIV-1 P24 ANTIGEN - HIV24: NONREACTIVE

## 2017-05-11 LAB — URINALYSIS, ROUTINE W REFLEX MICROSCOPIC
Bilirubin Urine: NEGATIVE
Glucose, UA: NEGATIVE mg/dL
Hgb urine dipstick: NEGATIVE
Ketones, ur: NEGATIVE mg/dL
Leukocytes, UA: NEGATIVE
Nitrite: NEGATIVE
Protein, ur: NEGATIVE mg/dL
Specific Gravity, Urine: 1.02 (ref 1.005–1.030)
pH: 6 (ref 5.0–8.0)

## 2017-05-11 LAB — CBC
HEMATOCRIT: 40.9 % (ref 36.0–46.0)
HEMOGLOBIN: 13.7 g/dL (ref 12.0–15.0)
MCH: 31.3 pg (ref 26.0–34.0)
MCHC: 33.5 g/dL (ref 30.0–36.0)
MCV: 93.4 fL (ref 78.0–100.0)
Platelets: 263 10*3/uL (ref 150–400)
RBC: 4.38 MIL/uL (ref 3.87–5.11)
RDW: 13.3 % (ref 11.5–15.5)
WBC: 7.6 10*3/uL (ref 4.0–10.5)

## 2017-05-11 LAB — HCG, QUANTITATIVE, PREGNANCY: hCG, Beta Chain, Quant, S: <1 m[IU]/mL (ref <5)

## 2017-05-11 NOTE — Progress Notes (Signed)
   05/11/17 0821  OBSTRUCTIVE SLEEP APNEA  Have you ever been diagnosed with sleep apnea through a sleep study? No  Do you snore loudly (loud enough to be heard through closed doors)?  1  Do you often feel tired, fatigued, or sleepy during the daytime (such as falling asleep during driving or talking to someone)? 1  Has anyone observed you stop breathing during your sleep? 0  Do you have, or are you being treated for high blood pressure? 1  BMI more than 35 kg/m2? 1  Age > 50 (1-yes) 0  Neck circumference greater than:Female 16 inches or larger, Female 17inches or larger? 1  Female Gender (Yes=1) 0  Obstructive Sleep Apnea Score 5  Score 5 or greater  Results sent to PCP

## 2017-05-16 ENCOUNTER — Encounter (HOSPITAL_COMMUNITY)
Admission: RE | Disposition: A | Discharge status: Home / Self Care | Payer: Self-pay | Source: Ambulatory Visit | Attending: Obstetrics & Gynecology

## 2017-05-16 ENCOUNTER — Ambulatory Visit (HOSPITAL_COMMUNITY)
Admission: RE | Admit: 2017-05-16 | Discharge: 2017-05-16 | Disposition: A | Discharge status: Home / Self Care | Payer: Medicaid Other | Source: Ambulatory Visit | Attending: Obstetrics & Gynecology | Admitting: Obstetrics & Gynecology

## 2017-05-16 ENCOUNTER — Ambulatory Visit (HOSPITAL_COMMUNITY): Payer: Medicaid Other | Admitting: Anesthesiology

## 2017-05-16 ENCOUNTER — Encounter (HOSPITAL_COMMUNITY): Payer: Self-pay

## 2017-05-16 DIAGNOSIS — Z3049 Encounter for surveillance of other contraceptives: Secondary | ICD-10-CM | POA: Diagnosis not present

## 2017-05-16 DIAGNOSIS — Z87891 Personal history of nicotine dependence: Secondary | ICD-10-CM | POA: Diagnosis not present

## 2017-05-16 DIAGNOSIS — F329 Major depressive disorder, single episode, unspecified: Secondary | ICD-10-CM | POA: Diagnosis not present

## 2017-05-16 DIAGNOSIS — I10 Essential (primary) hypertension: Secondary | ICD-10-CM | POA: Diagnosis not present

## 2017-05-16 DIAGNOSIS — Z302 Encounter for sterilization: Secondary | ICD-10-CM | POA: Diagnosis not present

## 2017-05-16 HISTORY — PX: LAPAROSCOPIC TUBAL LIGATION: SHX1937

## 2017-05-16 HISTORY — PX: IUD REMOVAL: SHX5392

## 2017-05-16 SURGERY — LIGATION, FALLOPIAN TUBE, LAPAROSCOPIC
Anesthesia: General | Site: Uterus

## 2017-05-16 MED ORDER — SUCCINYLCHOLINE CHLORIDE 20 MG/ML IJ SOLN
INTRAMUSCULAR | Status: DC | PRN
  Administered 2017-05-16: 100 mg via INTRAVENOUS

## 2017-05-16 MED ORDER — DEXAMETHASONE SODIUM PHOSPHATE 4 MG/ML IJ SOLN
INTRAMUSCULAR | Status: AC
  Filled 2017-05-16: qty 1

## 2017-05-16 MED ORDER — PROPOFOL 10 MG/ML IV BOLUS
INTRAVENOUS | Status: AC
  Filled 2017-05-16: qty 20

## 2017-05-16 MED ORDER — KETOROLAC TROMETHAMINE 30 MG/ML IJ SOLN
30.0000 mg | Freq: Once | INTRAMUSCULAR | Status: AC
  Administered 2017-05-16: 30 mg via INTRAVENOUS
  Filled 2017-05-16: qty 1

## 2017-05-16 MED ORDER — FENTANYL CITRATE (PF) 250 MCG/5ML IJ SOLN
INTRAMUSCULAR | Status: AC
Start: 2017-05-16 — End: ?
  Filled 2017-05-16: qty 5

## 2017-05-16 MED ORDER — GLYCOPYRROLATE 0.2 MG/ML IV SOSY
PREFILLED_SYRINGE | INTRAVENOUS | Status: DC | PRN
  Administered 2017-05-16: 0.4 mg via INTRAVENOUS

## 2017-05-16 MED ORDER — MIDAZOLAM HCL 2 MG/2ML IJ SOLN
1.0000 mg | INTRAMUSCULAR | Status: AC
  Administered 2017-05-16: 2 mg via INTRAVENOUS

## 2017-05-16 MED ORDER — SUCCINYLCHOLINE CHLORIDE 20 MG/ML IJ SOLN
INTRAMUSCULAR | Status: AC
  Filled 2017-05-16: qty 1

## 2017-05-16 MED ORDER — GLYCOPYRROLATE 0.2 MG/ML IJ SOLN
INTRAMUSCULAR | Status: AC
  Filled 2017-05-16: qty 1

## 2017-05-16 MED ORDER — NEOSTIGMINE METHYLSULFATE 5 MG/5ML IV SOSY
PREFILLED_SYRINGE | INTRAVENOUS | Status: DC | PRN
Start: 2017-05-16 — End: 2017-05-16
  Administered 2017-05-16: 3 mg via INTRAVENOUS

## 2017-05-16 MED ORDER — ONDANSETRON HCL 4 MG/2ML IJ SOLN
4.0000 mg | Freq: Once | INTRAMUSCULAR | Status: AC
  Administered 2017-05-16: 4 mg via INTRAVENOUS

## 2017-05-16 MED ORDER — LACTATED RINGERS IV SOLN
INTRAVENOUS | Status: DC
  Administered 2017-05-16: 09:00:00 via INTRAVENOUS

## 2017-05-16 MED ORDER — GLYCOPYRROLATE 0.2 MG/ML IJ SOLN
INTRAMUSCULAR | Status: AC
Start: 2017-05-16 — End: ?
  Filled 2017-05-16: qty 2

## 2017-05-16 MED ORDER — ROCURONIUM BROMIDE 10 MG/ML (PF) SYRINGE
PREFILLED_SYRINGE | INTRAVENOUS | Status: DC | PRN
  Administered 2017-05-16: 25 mg via INTRAVENOUS
  Administered 2017-05-16: 5 mg via INTRAVENOUS

## 2017-05-16 MED ORDER — KETOROLAC TROMETHAMINE 10 MG PO TABS: 10.0000 mg | ORAL_TABLET | Freq: Three times a day (TID) | ORAL | 0 refills | Status: DC | PRN

## 2017-05-16 MED ORDER — SUCCINYLCHOLINE CHLORIDE 200 MG/10ML IV SOSY: PREFILLED_SYRINGE | INTRAVENOUS | Status: DC | PRN

## 2017-05-16 MED ORDER — ROCURONIUM BROMIDE 50 MG/5ML IV SOLN
INTRAVENOUS | Status: AC
  Filled 2017-05-16: qty 1

## 2017-05-16 MED ORDER — FENTANYL CITRATE (PF) 100 MCG/2ML IJ SOLN
INTRAMUSCULAR | Status: DC | PRN
  Administered 2017-05-16 (×2): 100 ug via INTRAVENOUS

## 2017-05-16 MED ORDER — GLYCOPYRROLATE 0.2 MG/ML IJ SOLN
INTRAMUSCULAR | Status: AC
  Filled 2017-05-16: qty 2

## 2017-05-16 MED ORDER — MIDAZOLAM HCL 2 MG/2ML IJ SOLN
INTRAMUSCULAR | Status: AC
  Filled 2017-05-16: qty 2

## 2017-05-16 MED ORDER — ONDANSETRON HCL 4 MG/2ML IJ SOLN
INTRAMUSCULAR | Status: AC
  Filled 2017-05-16: qty 2

## 2017-05-16 MED ORDER — PROPOFOL 10 MG/ML IV BOLUS
INTRAVENOUS | Status: DC | PRN
  Administered 2017-05-16: 50 mg via INTRAVENOUS
  Administered 2017-05-16: 250 mg via INTRAVENOUS

## 2017-05-16 MED ORDER — NEOSTIGMINE METHYLSULFATE 10 MG/10ML IV SOLN
INTRAVENOUS | Status: AC
  Filled 2017-05-16: qty 1

## 2017-05-16 MED ORDER — FENTANYL CITRATE (PF) 100 MCG/2ML IJ SOLN
25.0000 ug | INTRAMUSCULAR | Status: DC | PRN
  Administered 2017-05-16: 50 ug via INTRAVENOUS
  Filled 2017-05-16: qty 2

## 2017-05-16 MED ORDER — CEFAZOLIN SODIUM-DEXTROSE 2-4 GM/100ML-% IV SOLN
2.0000 g | Freq: Once | INTRAVENOUS | Status: AC
  Administered 2017-05-16: 2 g via INTRAVENOUS
  Filled 2017-05-16: qty 100

## 2017-05-16 MED ORDER — SODIUM CHLORIDE 0.9 % IR SOLN
Status: DC | PRN
  Administered 2017-05-16: 1000 mL

## 2017-05-16 MED ORDER — CEFAZOLIN SODIUM-DEXTROSE 1-4 GM/50ML-% IV SOLN
1.0000 g | Freq: Once | INTRAVENOUS | Status: AC
  Administered 2017-05-16: 1 g via INTRAVENOUS
  Filled 2017-05-16: qty 50

## 2017-05-16 MED ORDER — GLYCOPYRROLATE 0.2 MG/ML IJ SOLN
0.2000 mg | Freq: Once | INTRAMUSCULAR | Status: AC
  Administered 2017-05-16: 0.2 mg via INTRAVENOUS

## 2017-05-16 MED ORDER — ONDANSETRON 8 MG PO TBDP: 8.0000 mg | ORAL_TABLET | Freq: Three times a day (TID) | ORAL | 0 refills | Status: DC | PRN

## 2017-05-16 MED ORDER — HYDROCODONE-ACETAMINOPHEN 5-325 MG PO TABS: 1.0000 | ORAL_TABLET | Freq: Four times a day (QID) | ORAL | 0 refills | Status: DC | PRN

## 2017-05-16 MED ORDER — CEFAZOLIN SODIUM-DEXTROSE 2-4 GM/100ML-% IV SOLN: 2.0000 g | INTRAVENOUS | Status: DC

## 2017-05-16 MED ORDER — DEXAMETHASONE SODIUM PHOSPHATE 4 MG/ML IJ SOLN
4.0000 mg | Freq: Once | INTRAMUSCULAR | Status: AC
  Administered 2017-05-16: 4 mg via INTRAVENOUS

## 2017-05-16 SURGICAL SUPPLY — 33 items
BAG HAMPER (MISCELLANEOUS) ×4 IMPLANT
BLADE SURG SZ11 CARB STEEL (BLADE) ×4 IMPLANT
CATH ROBINSON RED A/P 16FR (CATHETERS) ×4 IMPLANT
CLOTH BEACON ORANGE TIMEOUT ST (SAFETY) ×4 IMPLANT
COVER LIGHT HANDLE STERIS (MISCELLANEOUS) ×8 IMPLANT
ELECT REM PT RETURN 9FT ADLT (ELECTROSURGICAL) ×4
ELECTRODE REM PT RTRN 9FT ADLT (ELECTROSURGICAL) ×2 IMPLANT
GAUZE SPONGE 4X4 12PLY STRL (GAUZE/BANDAGES/DRESSINGS) ×4 IMPLANT
GLOVE BIOGEL PI IND STRL 7.0 (GLOVE) ×2 IMPLANT
GLOVE BIOGEL PI IND STRL 8 (GLOVE) ×2 IMPLANT
GLOVE BIOGEL PI INDICATOR 7.0 (GLOVE) ×2
GLOVE BIOGEL PI INDICATOR 8 (GLOVE) ×2
GLOVE ECLIPSE 8.0 STRL XLNG CF (GLOVE) ×4 IMPLANT
GOWN STRL REUS W/TWL LRG LVL3 (GOWN DISPOSABLE) ×8 IMPLANT
GOWN STRL REUS W/TWL XL LVL3 (GOWN DISPOSABLE) ×4 IMPLANT
INST SET LAPROSCOPIC GYN AP (KITS) ×4 IMPLANT
KIT ROOM TURNOVER AP CYSTO (KITS) ×4 IMPLANT
MANIFOLD NEPTUNE II (INSTRUMENTS) ×4 IMPLANT
NEEDLE INSUFFLATION 14GA 120MM (NEEDLE) ×4 IMPLANT
NS IRRIG 1000ML POUR BTL (IV SOLUTION) ×4 IMPLANT
PACK PERI GYN (CUSTOM PROCEDURE TRAY) ×4 IMPLANT
PAD ARMBOARD 7.5X6 YLW CONV (MISCELLANEOUS) ×4 IMPLANT
SET BASIN LINEN APH (SET/KITS/TRAYS/PACK) ×4 IMPLANT
SOLUTION ANTI FOG 6CC (MISCELLANEOUS) ×4 IMPLANT
SPONGE GAUZE 2X2 8PLY STER LF (GAUZE/BANDAGES/DRESSINGS) ×1
SPONGE GAUZE 2X2 8PLY STRL LF (GAUZE/BANDAGES/DRESSINGS) ×3 IMPLANT
STAPLER VISISTAT 35W (STAPLE) ×4 IMPLANT
SUT VICRYL 0 UR6 27IN ABS (SUTURE) ×4 IMPLANT
SYRINGE 10CC LL (SYRINGE) ×4 IMPLANT
TAPE CLOTH SURG 4X10 WHT LF (GAUZE/BANDAGES/DRESSINGS) ×4 IMPLANT
TROCAR ENDO BLADELESS 11MM (ENDOMECHANICALS) ×4 IMPLANT
TUBING INSUFFLATION (TUBING) ×4 IMPLANT
WARMER LAPAROSCOPE (MISCELLANEOUS) ×4 IMPLANT

## 2017-05-16 NOTE — H&P (Signed)
Preoperative History and Physical  Mary Burnett is a 31 y.o. [id number] with No LMP recorded. Patient is not currently having periods (Reason: IUD). multiparous desires permanent sterilizationadmitted for a laparoscopic bilateral tubal ligation and removal of IUD.    PMH:    Past Medical History:  Diagnosis Date  . Cervical high risk human papillomavirus (HPV) DNA test positive 04/03/2017  . Depression   . Hypertension     PSH:     Past Surgical History:  Procedure Laterality Date  . OVARIAN CYST REMOVAL Right     POb/GynH:      OB History    Gravida Para Term Preterm AB Living   2 2 2     2    SAB TAB Ectopic Multiple Live Births           2      SH:   Social History  Substance Use Topics  . Smoking status: Former Smoker    Packs/day: 1.00    Years: 6.00    Types: Cigarettes    Quit date: 05/11/2008  . Smokeless tobacco: Never Used     Comment: Quit in 2009  . Alcohol use 0.0 oz/week     Comment: occassional    FH:    Family History  Problem Relation Age of Onset  . Coronary artery disease Paternal Grandfather   . Hypertension Paternal Grandfather   . Coronary artery disease Paternal Grandmother   . Hypertension Paternal Grandmother   . Coronary artery disease Maternal Grandmother   . Hypertension Maternal Grandmother   . Coronary artery disease Maternal Grandfather   . Diabetes Maternal Grandfather   . Hypertension Maternal Grandfather   . Cancer Mother 28       cervical and then lymph nodes     Allergies: No Known Allergies  Medications:       Current Facility-Administered Medications:  .  ceFAZolin (ANCEF) IVPB 2g/100 mL premix, 2 g, Intravenous, Once **AND** ceFAZolin (ANCEF) IVPB 1 g/50 mL premix, 1 g, Intravenous, Once, Eure, Mertie Clause, MD .  lactated ringers infusion, , Intravenous, Continuous, Lerry Liner, MD, Last Rate: 75 mL/hr at 05/16/17 4540  Review of Systems:   Review of Systems  Constitutional: Negative for fever, chills,  weight loss, malaise/fatigue and diaphoresis.  HENT: Negative for hearing loss, ear pain, nosebleeds, congestion, sore throat, neck pain, tinnitus and ear discharge.   Eyes: Negative for blurred vision, double vision, photophobia, pain, discharge and redness.  Respiratory: Negative for cough, hemoptysis, sputum production, shortness of breath, wheezing and stridor.   Cardiovascular: Negative for chest pain, palpitations, orthopnea, claudication, leg swelling and PND.  Gastrointestinal: Positive for abdominal pain. Negative for heartburn, nausea, vomiting, diarrhea, constipation, blood in stool and melena.  Genitourinary: Negative for dysuria, urgency, frequency, hematuria and flank pain.  Musculoskeletal: Negative for myalgias, back pain, joint pain and falls.  Skin: Negative for itching and rash.  Neurological: Negative for dizziness, tingling, tremors, sensory change, speech change, focal weakness, seizures, loss of consciousness, weakness and headaches.  Endo/Heme/Allergies: Negative for environmental allergies and polydipsia. Does not bruise/bleed easily.  Psychiatric/Behavioral: Negative for depression, suicidal ideas, hallucinations, memory loss and substance abuse. The patient is not nervous/anxious and does not have insomnia.      PHYSICAL EXAM:  Blood pressure 115/70, pulse 87, temperature 98 F (36.7 C), temperature source Oral, resp. rate 18, SpO2 96 %.    Vitals reviewed. Constitutional: She is oriented to person, place, and time. She appears well-developed and well-nourished.  HENT:  Head: Normocephalic and atraumatic.  Right Ear: External ear normal.  Left Ear: External ear normal.  Nose: Nose normal.  Mouth/Throat: Oropharynx is clear and moist.  Eyes: Conjunctivae and EOM are normal. Pupils are equal, round, and reactive to light. Right eye exhibits no discharge. Left eye exhibits no discharge. No scleral icterus.  Neck: Normal range of motion. Neck supple. No tracheal  deviation present. No thyromegaly present.  Cardiovascular: Normal rate, regular rhythm, normal heart sounds and intact distal pulses.  Exam reveals no gallop and no friction rub.   No murmur heard. Respiratory: Effort normal and breath sounds normal. No respiratory distress. She has no wheezes. She has no rales. She exhibits no tenderness.  GI: Soft. Bowel sounds are normal. She exhibits no distension and no mass. There is tenderness. There is no rebound and no guarding.  Genitourinary:       Vulva is normal without lesions Vagina is pink moist without discharge Cervix normal in appearance and pap is normal Uterus is normal size, contour, position, consistency, mobility, non-tender Adnexa is negative with normal sized ovaries by sonogram  Musculoskeletal: Normal range of motion. She exhibits no edema and no tenderness.  Neurological: She is alert and oriented to person, place, and time. She has normal reflexes. She displays normal reflexes. No cranial nerve deficit. She exhibits normal muscle tone. Coordination normal.  Skin: Skin is warm and dry. No rash noted. No erythema. No pallor.  Psychiatric: She has a normal mood and affect. Her behavior is normal. Judgment and thought content normal.    Labs: Results for orders placed or performed during the hospital encounter of 05/11/17 (from the past 336 hour(s))  CBC   Collection Time: 05/11/17  8:13 AM  Result Value Ref Range   WBC 7.6 4.0 - 10.5 K/uL   RBC 4.38 3.87 - 5.11 MIL/uL   Hemoglobin 13.7 12.0 - 15.0 g/dL   HCT 40.9 36.0 - 46.0 %   MCV 93.4 78.0 - 100.0 fL   MCH 31.3 26.0 - 34.0 pg   MCHC 33.5 30.0 - 36.0 g/dL   RDW 13.3 11.5 - 15.5 %   Platelets 263 150 - 400 K/uL  hCG, quantitative, pregnancy   Collection Time: 05/11/17  8:13 AM  Result Value Ref Range   hCG, Beta Chain, Quant, S <1 <5 mIU/mL  Rapid HIV screen (HIV 1/2 Ab+Ag)   Collection Time: 05/11/17  8:13 AM  Result Value Ref Range   HIV-1 P24 Antigen - HIV24 NON  REACTIVE NON REACTIVE   HIV 1/2 Antibodies NON REACTIVE NON REACTIVE   Interpretation (HIV Ag Ab)      A non reactive test result means that HIV 1 or HIV 2 antibodies and HIV 1 p24 antigen were not detected in the specimen.  Urinalysis, Routine w reflex microscopic   Collection Time: 05/11/17  8:13 AM  Result Value Ref Range   Color, Urine YELLOW YELLOW   APPearance HAZY (A) CLEAR   Specific Gravity, Urine 1.020 1.005 - 1.030   pH 6.0 5.0 - 8.0   Glucose, UA NEGATIVE NEGATIVE mg/dL   Hgb urine dipstick NEGATIVE NEGATIVE   Bilirubin Urine NEGATIVE NEGATIVE   Ketones, ur NEGATIVE NEGATIVE mg/dL   Protein, ur NEGATIVE NEGATIVE mg/dL   Nitrite NEGATIVE NEGATIVE   Leukocytes, UA NEGATIVE NEGATIVE  Comprehensive metabolic panel   Collection Time: 05/11/17  8:13 AM  Result Value Ref Range   Sodium 140 135 - 145 mmol/L   Potassium 3.7 3.5 - 5.1  mmol/L   Chloride 105 101 - 111 mmol/L   CO2 26 22 - 32 mmol/L   Glucose, Bld 85 65 - 99 mg/dL   BUN 11 6 - 20 mg/dL   Creatinine, Ser 0.51 0.44 - 1.00 mg/dL   Calcium 8.7 (L) 8.9 - 10.3 mg/dL   Total Protein 6.9 6.5 - 8.1 g/dL   Albumin 3.7 3.5 - 5.0 g/dL   AST 21 15 - 41 U/L   ALT 21 14 - 54 U/L   Alkaline Phosphatase 59 38 - 126 U/L   Total Bilirubin 0.8 0.3 - 1.2 mg/dL   GFR calc non Af Amer >60 >60 mL/min   GFR calc Af Amer >60 >60 mL/min   Anion gap 9 5 - 15    EKG: Orders placed or performed during the hospital encounter of 05/11/17  . EKG 12-Lead  . EKG 12-Lead    Imaging Studies: No results found.    Assessment: Multiparous female desires permanent sterilization Desires IUD removal Patient Active Problem List   Diagnosis Date Noted  . Cervical high risk human papillomavirus (HPV) DNA test positive 04/03/2017  . IUD (intrauterine device) in place 04/03/2017  . Encounter for routine gynecological examination with Papanicolaou smear of cervix 04/03/2017  . Essential hypertension 04/03/2017  . Depression 01/14/2013     Plan: Laparoscopic Bilateral tubal ligation using electrocautery, removal of her IUD  EURE,LUTHER H 05/16/2017 9:42 AM

## 2017-05-16 NOTE — Anesthesia Preprocedure Evaluation (Signed)
Anesthesia Evaluation  Patient identified by MRN, date of birth, ID band Patient awake    Reviewed: Allergy & Precautions, NPO status , Patient's Chart, lab work & pertinent test results  Airway Mallampati: III  TM Distance: >3 FB Neck ROM: Full    Dental  (+) Teeth Intact, Dental Advisory Given   Pulmonary neg pulmonary ROS, former smoker,    breath sounds clear to auscultation       Cardiovascular hypertension, Pt. on medications  Rhythm:Regular Rate:Normal     Neuro/Psych PSYCHIATRIC DISORDERS Depression    GI/Hepatic negative GI ROS, Neg liver ROS,   Endo/Other  Morbid obesity  Renal/GU negative Renal ROS     Musculoskeletal   Abdominal   Peds  Hematology negative hematology ROS (+)   Anesthesia Other Findings   Reproductive/Obstetrics                             Anesthesia Physical Anesthesia Plan  ASA: II  Anesthesia Plan: General   Post-op Pain Management:    Induction: Intravenous, Rapid sequence and Cricoid pressure planned  PONV Risk Score and Plan:   Airway Management Planned: Oral ETT and Video Laryngoscope Planned  Additional Equipment:   Intra-op Plan:   Post-operative Plan: Extubation in OR  Informed Consent: I have reviewed the patients History and Physical, chart, labs and discussed the procedure including the risks, benefits and alternatives for the proposed anesthesia with the patient or authorized representative who has indicated his/her understanding and acceptance.     Plan Discussed with:   Anesthesia Plan Comments:         Anesthesia Quick Evaluation

## 2017-05-16 NOTE — Anesthesia Procedure Notes (Signed)
Procedure Name: Intubation Date/Time: 05/16/2017 10:19 AM Performed by: Jonna Munro Pre-anesthesia Checklist: Patient identified, Emergency Drugs available, Suction available, Patient being monitored and Timeout performed Patient Re-evaluated:Patient Re-evaluated prior to induction Oxygen Delivery Method: Circle system utilized Preoxygenation: Pre-oxygenation with 100% oxygen Induction Type: Rapid sequence, Cricoid Pressure applied and IV induction Laryngoscope Size: Glidescope and 3 Grade View: Grade I Tube type: Oral Tube size: 7.0 mm Number of attempts: 1 Airway Equipment and Method: Stylet and Video-laryngoscopy Placement Confirmation: ETT inserted through vocal cords under direct vision,  positive ETCO2 and breath sounds checked- equal and bilateral Secured at: 21 cm Tube secured with: Tape Dental Injury: Teeth and Oropharynx as per pre-operative assessment

## 2017-05-16 NOTE — Anesthesia Postprocedure Evaluation (Signed)
Anesthesia Post Note  Patient: Mary Burnett  Procedure(s) Performed: Procedure(s) (LRB): LAPAROSCOPIC TUBAL LIGATION  (USING ELECTROCAUTERY) (Bilateral) INTRAUTERINE DEVICE (IUD) REMOVAL (N/A)  Patient location during evaluation: PACU Anesthesia Type: General Level of consciousness: awake Pain management: pain level controlled Vital Signs Assessment: post-procedure vital signs reviewed and stable Respiratory status: spontaneous breathing, nonlabored ventilation and respiratory function stable Cardiovascular status: blood pressure returned to baseline Postop Assessment: no signs of nausea or vomiting Anesthetic complications: no     Last Vitals:  Vitals:   05/16/17 1148 05/16/17 1150  BP:  129/72  Pulse: 61   Resp:    Temp: 36.5 C   SpO2: 96%     Last Pain:  Vitals:   05/16/17 1148  TempSrc: Oral  PainSc:                  Ardice Boyan J

## 2017-05-16 NOTE — Transfer of Care (Signed)
Immediate Anesthesia Transfer of Care Note  Patient: Mary Burnett  Procedure(s) Performed: Procedure(s): LAPAROSCOPIC TUBAL LIGATION  (USING ELECTROCAUTERY) (Bilateral) INTRAUTERINE DEVICE (IUD) REMOVAL (N/A)  Patient Location: PACU  Anesthesia Type:General  Level of Consciousness: awake, alert  and oriented  Airway & Oxygen Therapy: Patient Spontanous Breathing and Patient connected to nasal cannula oxygen  Post-op Assessment: Report given to RN and Post -op Vital signs reviewed and stable  Post vital signs: Reviewed and stable  Last Vitals:  Vitals:   05/16/17 1000 05/16/17 1005  BP: 124/81 120/80  Pulse:    Resp: 18 16  Temp:    SpO2: 94% 94%    Last Pain:  Vitals:   05/16/17 0805  TempSrc: Oral         Complications: No apparent anesthesia complications

## 2017-05-16 NOTE — Op Note (Signed)
Preoperative Diagnosis:  Multiparous female desires permanent sterilization  Postoperative Diagnosis:  Same as above  Procedure:  Laparoscopic Bilateral Tubal Ligation using electrocautery with removal of IUD  Surgeon:  Jacelyn Grip MD  Anaesthesia:  LMA  Findings:  Patient had normal pelvic anatomy and no intraperitoneal abnormalities.  Description of Operation:  Patient was taken to the OR and placed into supine position where she underwent LMA anaesthesia.  She was placed in the dorsal lithotomy position and prepped and draped in the usual sterile fashion.  An incision was made in the umbilicus and dissection taken down to the rectus fascia which was incised and opened.  The non bladed trocar was then placed and the peritoneal cavity was insufflated.  The above noted findings were observed.  The Kleppinger electrocautery unit was employed and both tubes were burned to no resistance and beyond in the distal ishtmic and ampullary regions, approximately a 3.5 cm segment bilaterally.  There was good hemostasis.  The fascia, peritoneum and subcutaneous tissue were closed using 0 vicryl.  The skin was closed using staples.  The patient was awakened from anaesthesia and taken to the PACU with all counts being correct x 3.  The patient received Ancef 3 grams and Toradol 30 mg IV preoperatively.  The Mirena IUD was removed without difficulty  Florian Buff, MD 05/16/2017 10:56 AM

## 2017-05-16 NOTE — Discharge Instructions (Signed)
Laparoscopic Tubal Ligation, Care After °Refer to this sheet in the next few weeks. These instructions provide you with information about caring for yourself after your procedure. Your health care provider may also give you more specific instructions. Your treatment has been planned according to current medical practices, but problems sometimes occur. Call your health care provider if you have any problems or questions after your procedure. °What can I expect after the procedure? °After the procedure, it is common to have: °· A sore throat. °· Discomfort in your shoulder. °· Mild discomfort or cramping in your abdomen. °· Gas pains. °· Pain or soreness in the area where the surgical cut (incision) was made. °· A bloated feeling. °· Tiredness. °· Nausea. °· Vomiting. ° °Follow these instructions at home: °Medicines °· Take over-the-counter and prescription medicines only as told by your health care provider. °· Do not take aspirin because it can cause bleeding. °· Do not drive or operate heavy machinery while taking prescription pain medicine. °Activity °· Rest for the rest of the day. °· Return to your normal activities as told by your health care provider. Ask your health care provider what activities are safe for you. °Incision care ° °· Follow instructions from your health care provider about how to take care of your incision. Make sure you: °? Wash your hands with soap and water before you change your bandage (dressing). If soap and water are not available, use hand sanitizer. °? Change your dressing as told by your health care provider. °? Leave stitches (sutures) in place. They may need to stay in place for 2 weeks or longer. °· Check your incision area every day for signs of infection. Check for: °? More redness, swelling, or pain. °? More fluid or blood. °? Warmth. °? Pus or a bad smell. °Other Instructions °· Do not take baths, swim, or use a hot tub until your health care provider approves. You may take  showers. °· Keep all follow-up visits as told by your health care provider. This is important. °· Have someone help you with your daily household tasks for the first few days. °Contact a health care provider if: °· You have more redness, swelling, or pain around your incision. °· Your incision feels warm to the touch. °· You have pus or a bad smell coming from your incision. °· The edges of your incision break open after the sutures have been removed. °· Your pain does not improve after 2-3 days. °· You have a rash. °· You repeatedly become dizzy or light-headed. °· Your pain medicine is not helping. °· You are constipated. °Get help right away if: °· You have a fever. °· You faint. °· You have increasing pain in your abdomen. °· You have severe pain in one or both of your shoulders. °· You have fluid or blood coming from your sutures or from your vagina. °· You have shortness of breath or difficulty breathing. °· You have chest pain or leg pain. °· You have ongoing nausea, vomiting, or diarrhea. °This information is not intended to replace advice given to you by your health care provider. Make sure you discuss any questions you have with your health care provider. °Document Released: 04/07/2005 Document Revised: 02/21/2016 Document Reviewed: 08/29/2015 °Elsevier Interactive Patient Education © 2018 [employer] ° °

## 2017-05-17 ENCOUNTER — Encounter (HOSPITAL_COMMUNITY): Payer: Self-pay | Admitting: Obstetrics & Gynecology

## 2017-05-22 ENCOUNTER — Telehealth: Payer: Self-pay | Admitting: Obstetrics & Gynecology

## 2017-05-22 NOTE — Telephone Encounter (Signed)
Pt called stating that she had a red area around one of her staples post tubal ligation. She states that she has some drainage but it is clear. She states that she had used peroxide to clean it and she has a follow up appt on Thursday. I advised pt to not use peroxide any more, to clean with just a clean qtip and water. Advised pt that her appt on Thursday should be fine but to call us back if she experiences green/yellow drainage, incision becomes hot to touch, or she starts running a fever and we would see her sooner. Pt verbalized understanding.

## 2017-05-24 ENCOUNTER — Ambulatory Visit (INDEPENDENT_AMBULATORY_CARE_PROVIDER_SITE_OTHER): Payer: Medicaid Other | Admitting: Obstetrics & Gynecology

## 2017-05-24 ENCOUNTER — Encounter: Payer: Self-pay | Admitting: Obstetrics & Gynecology

## 2017-05-24 VITALS — BP 152/100 | HR 97 | Ht 63.0 in | Wt 269.0 lb

## 2017-05-24 DIAGNOSIS — Z9889 Other specified postprocedural states: Secondary | ICD-10-CM

## 2017-05-24 DIAGNOSIS — Z9851 Tubal ligation status: Secondary | ICD-10-CM

## 2017-05-24 NOTE — Progress Notes (Signed)
  HPI: Patient returns for routine postoperative follow-up having undergone laparoscopic bilateral tubal ligation on 05/16/2017.  The patient's immediate postoperative recovery has been unremarkable. Since hospital discharge the patient reports no complaints.   Current Outpatient Prescriptions: HYDROcodone-acetaminophen (NORCO/VICODIN) 5-325 MG tablet, Take 1 tablet by mouth every 6 (six) hours as needed., Disp: 30 tablet, Rfl: 0 losartan (COZAAR) 25 MG tablet, Take 25 mg by mouth daily., Disp: , Rfl:  ketorolac (TORADOL) 10 MG tablet, Take 1 tablet (10 mg total) by mouth every 8 (eight) hours as needed. (Patient not taking: Reported on 05/24/2017), Disp: 15 tablet, Rfl: 0 ondansetron (ZOFRAN ODT) 8 MG disintegrating tablet, Take 1 tablet (8 mg total) by mouth every 8 (eight) hours as needed for nausea or vomiting. (Patient not taking: Reported on 05/24/2017), Disp: 20 tablet, Rfl: 0  No current facility-administered medications for this visit.     Blood pressure (!) 152/100, pulse 97, height 5\' 3"  (1.6 m), weight 269 lb (122 kg).  Physical Exam: Incision clean dry intact  Diagnostic Tests:   Pathology:   Impression: S/p Lap BTL  Plan:   Follow up:   Return in about 2 years (around 05/25/2019) for yearly, with Dr Elonda Husky.   Florian Buff, MD

## 2017-08-29 NOTE — Progress Notes (Signed)
Preoperative History and Physical  Mary Burnett is a 31 y.o. [id number] with No LMP recorded. Patient is not currently having periods (Reason: IUD). multiparous desires permanent sterilizationadmitted for a laparoscopic bilateral tubal ligation and removal of IUD.    PMH:        Past Medical History:  Diagnosis Date  . Cervical high risk human papillomavirus (HPV) DNA test positive 04/03/2017  . Depression   . Hypertension     PSH:          Past Surgical History:  Procedure Laterality Date  . OVARIAN CYST REMOVAL Right     POb/GynH:              OB History    Gravida Para Term Preterm AB Living   2 2 2     2    SAB TAB Ectopic Multiple Live Births           2      SH:          Social History  Substance Use Topics  . Smoking status: Former Smoker    Packs/day: 1.00    Years: 6.00    Types: Cigarettes    Quit date: 05/11/2008  . Smokeless tobacco: Never Used     Comment: Quit in 2009  . Alcohol use 0.0 oz/week      Comment: occassional    FH:         Family History  Problem Relation Age of Onset  . Coronary artery disease Paternal Grandfather   . Hypertension Paternal Grandfather   . Coronary artery disease Paternal Grandmother   . Hypertension Paternal Grandmother   . Coronary artery disease Maternal Grandmother   . Hypertension Maternal Grandmother   . Coronary artery disease Maternal Grandfather   . Diabetes Maternal Grandfather   . Hypertension Maternal Grandfather   . Cancer Mother 69       cervical and then lymph nodes     Allergies: No Known Allergies  Medications:       Current Facility-Administered Medications:  .  ceFAZolin (ANCEF) IVPB 2g/100 mL premix, 2 g, Intravenous, Once **AND** ceFAZolin (ANCEF) IVPB 1 g/50 mL premix, 1 g, Intravenous, Once, [facility], Mertie Clause, MD .  lactated ringers infusion, , Intravenous, Continuous, Lerry Liner, MD, Last Rate: 75 mL/hr at 05/16/17 4540  Review of  Systems:   Review of Systems  Constitutional: Negative for fever, chills, weight loss, malaise/fatigue and diaphoresis.  HENT: Negative for hearing loss, ear pain, nosebleeds, congestion, sore throat, neck pain, tinnitus and ear discharge.   Eyes: Negative for blurred vision, double vision, photophobia, pain, discharge and redness.  Respiratory: Negative for cough, hemoptysis, sputum production, shortness of breath, wheezing and stridor.   Cardiovascular: Negative for chest pain, palpitations, orthopnea, claudication, leg swelling and PND.  Gastrointestinal: Positive for abdominal pain. Negative for heartburn, nausea, vomiting, diarrhea, constipation, blood in stool and melena.  Genitourinary: Negative for dysuria, urgency, frequency, hematuria and flank pain.  Musculoskeletal: Negative for myalgias, back pain, joint pain and falls.  Skin: Negative for itching and rash.  Neurological: Negative for dizziness, tingling, tremors, sensory change, speech change, focal weakness, seizures, loss of consciousness, weakness and headaches.  Endo/Heme/Allergies: Negative for environmental allergies and polydipsia. Does not bruise/bleed easily.  Psychiatric/Behavioral: Negative for depression, suicidal ideas, hallucinations, memory loss and substance abuse. The patient is not nervous/anxious and does not have insomnia.      PHYSICAL EXAM:  Blood pressure 115/70, pulse 87, temperature 98 F (36.7 C),  temperature source Oral, resp. rate 18, SpO2 96 %.    Vitals reviewed. Constitutional: She is oriented to person, place, and time. She appears well-developed and well-nourished.  HENT:  Head: Normocephalic and atraumatic.  Right Ear: External ear normal.  Left Ear: External ear normal.  Nose: Nose normal.  Mouth/Throat: Oropharynx is clear and moist.  Eyes: Conjunctivae and EOM are normal. Pupils are equal, round, and reactive to light. Right eye exhibits no discharge. Left eye exhibits no  discharge. No scleral icterus.  Neck: Normal range of motion. Neck supple. No tracheal deviation present. No thyromegaly present.  Cardiovascular: Normal rate, regular rhythm, normal heart sounds and intact distal pulses.  Exam reveals no gallop and no friction rub.   No murmur heard. Respiratory: Effort normal and breath sounds normal. No respiratory distress. She has no wheezes. She has no rales. She exhibits no tenderness.  GI: Soft. Bowel sounds are normal. She exhibits no distension and no mass. There is tenderness. There is no rebound and no guarding.  Genitourinary:       Vulva is normal without lesions Vagina is pink moist without discharge Cervix normal in appearance and pap is normal Uterus is normal size, contour, position, consistency, mobility, non-tender Adnexa is negative with normal sized ovaries by sonogram  Musculoskeletal: Normal range of motion. She exhibits no edema and no tenderness.  Neurological: She is alert and oriented to person, place, and time. She has normal reflexes. She displays normal reflexes. No cranial nerve deficit. She exhibits normal muscle tone. Coordination normal.  Skin: Skin is warm and dry. No rash noted. No erythema. No pallor.  Psychiatric: She has a normal mood and affect. Her behavior is normal. Judgment and thought content normal.    Labs:       Results for orders placed or performed during the hospital encounter of 05/11/17 (from the past 336 hour(s))  CBC   Collection Time: 05/11/17  8:13 AM  Result Value Ref Range   WBC 7.6 4.0 - 10.5 K/uL   RBC 4.38 3.87 - 5.11 MIL/uL   Hemoglobin 13.7 12.0 - 15.0 g/dL   HCT 40.9 36.0 - 46.0 %   MCV 93.4 78.0 - 100.0 fL   MCH 31.3 26.0 - 34.0 pg   MCHC 33.5 30.0 - 36.0 g/dL   RDW 13.3 11.5 - 15.5 %   Platelets 263 150 - 400 K/uL  hCG, quantitative, pregnancy   Collection Time: 05/11/17  8:13 AM  Result Value Ref Range   hCG, Beta Chain, Quant, S <1 <5 mIU/mL  Rapid HIV screen (HIV  1/2 Ab+Ag)   Collection Time: 05/11/17  8:13 AM  Result Value Ref Range   HIV-1 P24 Antigen - HIV24 NON REACTIVE NON REACTIVE   HIV 1/2 Antibodies NON REACTIVE NON REACTIVE   Interpretation (HIV Ag Ab)      A non reactive test result means that HIV 1 or HIV 2 antibodies and HIV 1 p24 antigen were not detected in the specimen.  Urinalysis, Routine w reflex microscopic   Collection Time: 05/11/17  8:13 AM  Result Value Ref Range   Color, Urine YELLOW YELLOW   APPearance HAZY (A) CLEAR   Specific Gravity, Urine 1.020 1.005 - 1.030   pH 6.0 5.0 - 8.0   Glucose, UA NEGATIVE NEGATIVE mg/dL   Hgb urine dipstick NEGATIVE NEGATIVE   Bilirubin Urine NEGATIVE NEGATIVE   Ketones, ur NEGATIVE NEGATIVE mg/dL   Protein, ur NEGATIVE NEGATIVE mg/dL   Nitrite NEGATIVE NEGATIVE  Leukocytes, UA NEGATIVE NEGATIVE  Comprehensive metabolic panel   Collection Time: 05/11/17  8:13 AM  Result Value Ref Range   Sodium 140 135 - 145 mmol/L   Potassium 3.7 3.5 - 5.1 mmol/L   Chloride 105 101 - 111 mmol/L   CO2 26 22 - 32 mmol/L   Glucose, Bld 85 65 - 99 mg/dL   BUN 11 6 - 20 mg/dL   Creatinine, Ser 0.51 0.44 - 1.00 mg/dL   Calcium 8.7 (L) 8.9 - 10.3 mg/dL   Total Protein 6.9 6.5 - 8.1 g/dL   Albumin 3.7 3.5 - 5.0 g/dL   AST 21 15 - 41 U/L   ALT 21 14 - 54 U/L   Alkaline Phosphatase 59 38 - 126 U/L   Total Bilirubin 0.8 0.3 - 1.2 mg/dL   GFR calc non Af Amer >60 >60 mL/min   GFR calc Af Amer >60 >60 mL/min   Anion gap 9 5 - 15    EKG:    Orders placed or performed during the hospital encounter of 05/11/17  . EKG 12-Lead  . EKG 12-Lead    Imaging Studies: ImagingResults  No results found.      Assessment: Multiparous female desires permanent sterilization Desires IUD removal     Patient Active Problem List   Diagnosis Date Noted  . Cervical high risk human papillomavirus (HPV) DNA test positive 04/03/2017  . IUD (intrauterine device) in  place 04/03/2017  . Encounter for routine gynecological examination with Papanicolaou smear of cervix 04/03/2017  . Essential hypertension 04/03/2017  . Depression 01/14/2013    Plan: Laparoscopic Bilateral tubal ligation using electrocautery, removal of her IUD

## 2019-10-28 ENCOUNTER — Other Ambulatory Visit: Payer: Self-pay

## 2019-10-28 ENCOUNTER — Ambulatory Visit: Payer: Medicaid Other | Attending: Internal Medicine

## 2019-10-28 DIAGNOSIS — Z20822 Contact with and (suspected) exposure to covid-19: Secondary | ICD-10-CM | POA: Insufficient documentation

## 2019-10-29 LAB — NOVEL CORONAVIRUS, NAA: SARS-CoV-2, NAA: NOT DETECTED

## 2020-06-16 ENCOUNTER — Other Ambulatory Visit: Payer: Medicaid Other

## 2020-06-16 ENCOUNTER — Other Ambulatory Visit: Payer: Self-pay

## 2020-06-16 DIAGNOSIS — Z20822 Contact with and (suspected) exposure to covid-19: Secondary | ICD-10-CM

## 2020-06-18 LAB — SARS-COV-2, NAA 2 DAY TAT

## 2020-06-18 LAB — NOVEL CORONAVIRUS, NAA: SARS-CoV-2, NAA: NOT DETECTED

## 2021-12-28 ENCOUNTER — Ambulatory Visit (INDEPENDENT_AMBULATORY_CARE_PROVIDER_SITE_OTHER): Payer: Medicaid Other | Admitting: Women's Health

## 2021-12-28 ENCOUNTER — Encounter: Payer: Self-pay | Admitting: Women's Health

## 2021-12-28 ENCOUNTER — Other Ambulatory Visit (HOSPITAL_COMMUNITY)
Admission: RE | Admit: 2021-12-28 | Discharge: 2021-12-28 | Disposition: A | Discharge status: Home / Self Care | Payer: Medicaid Other | Source: Ambulatory Visit | Attending: Women's Health | Admitting: Women's Health

## 2021-12-28 ENCOUNTER — Other Ambulatory Visit: Payer: Self-pay

## 2021-12-28 VITALS — BP 131/96 | HR 82 | Ht 63.0 in | Wt 259.2 lb

## 2021-12-28 DIAGNOSIS — Z0000 Encounter for general adult medical examination without abnormal findings: Secondary | ICD-10-CM | POA: Diagnosis not present

## 2021-12-28 DIAGNOSIS — Z113 Encounter for screening for infections with a predominantly sexual mode of transmission: Secondary | ICD-10-CM | POA: Diagnosis not present

## 2021-12-28 DIAGNOSIS — Z01419 Encounter for gynecological examination (general) (routine) without abnormal findings: Secondary | ICD-10-CM

## 2021-12-28 DIAGNOSIS — Z8049 Family history of malignant neoplasm of other genital organs: Secondary | ICD-10-CM

## 2021-12-28 DIAGNOSIS — Z8543 Personal history of malignant neoplasm of ovary: Secondary | ICD-10-CM | POA: Diagnosis not present

## 2021-12-28 DIAGNOSIS — D279 Benign neoplasm of unspecified ovary: Secondary | ICD-10-CM

## 2021-12-28 NOTE — Progress Notes (Signed)
? ?WELL-WOMAN EXAMINATION ?Patient name: Mary Burnett MRN [id number]  Date of birth: 1986-06-11 ?Chief Complaint:   ?Gynecologic Exam ? ?History of Present Illness:   ?Mary Burnett is a 36 y.o. [id number] Caucasian female being seen today for a routine [facility] well-woman exam.  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 Not having any pain/problems ?Bulge Lt abd, not painful ? ?PCP: Larene Pickett     ?Patient's last menstrual period was 12/05/2021 (exact date). ?The current method of family planning is tubal ligation.  ?Last pap 04/03/17. Results were: NILM w/ HRHPV negative. H/O abnormal pap: +HRHPV 2017 ?Last mammogram: never. Results were: N/A. Family h/o breast cancer: no ?Last colonoscopy: never. Results were: N/A. Family h/o colorectal cancer: no ?Mom passed from cervical cancer @ 79yo ? ? ?  12/28/2021  ?  9:59 AM 04/03/2017  ?  3:11 PM  ?Depression screen PHQ 2/9  ?Decreased Interest 2 0  ?Down, Depressed, Hopeless 1 0  ?PHQ - 2 Score 3 0  ?Altered sleeping 3   ?Tired, decreased energy 3   ?Change in appetite 0   ?Feeling bad or failure about yourself  2   ?Trouble concentrating 3   ?Moving slowly or fidgety/restless 0   ?Suicidal thoughts 0   ?PHQ-9 Score 14   ? ?  ? ?  12/28/2021  ?  9:59 AM  ?GAD 7 : Generalized Anxiety Score  ?Nervous, Anxious, on Edge 2  ?Control/stop worrying 2  ?Worry too much - different things 2  ?Trouble relaxing 2  ?Restless 1  ?Easily annoyed or irritable 1  ?Afraid - awful might happen 0  ?Total GAD 7 Score 10  ? ? ? ?Review of Systems:   ?Pertinent items are noted in HPI ?Denies any headaches, blurred vision, fatigue, shortness of breath, chest pain, abdominal pain, abnormal vaginal discharge/itching/odor/irritation, problems with periods, bowel movements, urination, or intercourse unless otherwise stated above. ?Pertinent History Reviewed:  ?Reviewed past medical,surgical, social and family history.  ?Reviewed problem list,  medications and allergies. ?Physical Assessment:  ? ?Vitals:  ? 12/28/21 0953  ?BP: (!) 131/96  ?Pulse: 82  ?Weight: 259 lb 3.2 oz (117.6 kg)  ?Height: '5\' 3"'$  (1.6 m)  ?Body mass index is 45.92 kg/m?. ?  ?     Physical Examination:  ? General appearance - well appearing, and in no distress ? Mental status - alert, oriented to person, place, and time ? Psych:  She has a normal mood and affect ? Skin - warm and dry, normal color, no suspicious lesions noted ? Chest - effort normal, all lung fields clear to auscultation bilaterally ? Heart - normal rate and regular rhythm ? Neck:  midline trachea, no thyromegaly or nodules ? Breasts - breasts appear normal, no suspicious masses, no skin or nipple changes or  axillary nodes ? Abdomen - soft, nontender, nondistended, no masses or organomegaly, large vertical scar. Bulge Lt abd about level of umbilicus, nontender ? Pelvic - VULVA: normal appearing vulva with no masses, tenderness or lesions  VAGINA: normal appearing vagina with normal color and discharge, no lesions  CERVIX: normal appearing cervix without discharge or lesions, no CMT ? Thin prep pap is done w/ HR HPV cotesting ? UTERUS: uterus is felt to be normal size, shape, consistency and nontender  ? ADNEXA: No adnexal masses or tenderness noted. ? Extremities:  No swelling or varicosities noted ? ?Chaperone: Celene Squibb   ? ?No results found  for this or any previous visit (from the past 24 hour(s)).  ?Assessment & Plan:  ?1) [facility] Well-Woman Exam ? ?2) STD screen ? ?3) H/O 17lb ovarian teratoma removal @ 36yo> wants to know if needs any f/u, not currently having any problems, MD not here to discuss, sent Dr. Elonda Husky an epic message, will let pt know ? ?4) Possible abdominal hernia> no pain, if worsens/painful let us know/go to general surgery ? ?Labs/procedures today: pap, STD labs per [facility] ? ?Mammogram: @ 36yo, or sooner if problems ?Colonoscopy: @ 36yo, or sooner if problems ? ?Orders Placed This Encounter   ?Procedures  ? HIV Antibody (routine testing w rflx)  ? RPR  ? Hepatitis C Antibody  ? Hepatitis B Surface AntiGEN  ? ? ?Meds: No orders of the defined types were placed in this encounter. ? ? ?Follow-up: Return in about 1 year (around 12/29/2022) for Physical. ? ?Roma Schanz CNM, WHNP-BC ?12/28/2021 ?10:31 AM  ?

## 2021-12-29 ENCOUNTER — Other Ambulatory Visit: Payer: Self-pay | Admitting: Women's Health

## 2021-12-29 ENCOUNTER — Encounter: Payer: Self-pay | Admitting: Women's Health

## 2021-12-29 DIAGNOSIS — Z90721 Acquired absence of ovaries, unilateral: Secondary | ICD-10-CM

## 2021-12-29 DIAGNOSIS — Z86018 Personal history of other benign neoplasm: Secondary | ICD-10-CM

## 2021-12-29 LAB — RPR: RPR Ser Ql: NONREACTIVE

## 2021-12-29 LAB — HIV ANTIBODY (ROUTINE TESTING W REFLEX): HIV Screen 4th Generation wRfx: NONREACTIVE

## 2021-12-29 LAB — HEPATITIS C ANTIBODY: Hep C Virus Ab: NONREACTIVE

## 2021-12-29 LAB — HEPATITIS B SURFACE ANTIGEN: Hepatitis B Surface Ag: NEGATIVE

## 2021-12-30 LAB — CYTOLOGY - PAP
Chlamydia: NEGATIVE
Comment: NEGATIVE
Comment: NEGATIVE
Comment: NORMAL
Diagnosis: NEGATIVE
High risk HPV: NEGATIVE
Neisseria Gonorrhea: NEGATIVE

## 2022-12-19 ENCOUNTER — Ambulatory Visit: Payer: Medicaid Other | Admitting: Obstetrics & Gynecology

## 2023-01-12 ENCOUNTER — Ambulatory Visit (INDEPENDENT_AMBULATORY_CARE_PROVIDER_SITE_OTHER): Payer: Self-pay | Admitting: Obstetrics and Gynecology

## 2023-01-12 ENCOUNTER — Encounter: Payer: Self-pay | Admitting: Obstetrics and Gynecology

## 2023-01-12 DIAGNOSIS — Z01419 Encounter for gynecological examination (general) (routine) without abnormal findings: Secondary | ICD-10-CM | POA: Insufficient documentation

## 2023-01-12 NOTE — Progress Notes (Signed)
Pt was not seen due to having MCD FP and H/O BTL in 2018. Discussed with pt Verbalized understanding Last 2 yrs of pap smears were normal

## 2023-01-17 ENCOUNTER — Encounter: Payer: Self-pay | Admitting: Obstetrics and Gynecology

## 2023-01-18 ENCOUNTER — Other Ambulatory Visit: Payer: Self-pay | Admitting: Obstetrics & Gynecology

## 2023-01-18 DIAGNOSIS — Z8742 Personal history of other diseases of the female genital tract: Secondary | ICD-10-CM

## 2023-01-18 NOTE — Progress Notes (Signed)
Orders for ultrasound placed.
